# Patient Record
Sex: Female | Born: 2005 | Race: White | Hispanic: No | Marital: Single | Smoking: Never smoker
Health system: Southern US, Community
[De-identification: ages and names within clinical notes are randomized; demographics above are authoritative.]

## PROBLEM LIST (undated history)

## (undated) DIAGNOSIS — T7840XA Allergy, unspecified, initial encounter: Secondary | ICD-10-CM

## (undated) DIAGNOSIS — J45909 Unspecified asthma, uncomplicated: Secondary | ICD-10-CM

## (undated) HISTORY — DX: Allergy, unspecified, initial encounter: T78.40XA

## (undated) HISTORY — DX: Unspecified asthma, uncomplicated: J45.909

---
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
## 2023-02-05 DIAGNOSIS — Z20822 Contact with and (suspected) exposure to covid-19: Secondary | ICD-10-CM | POA: Diagnosis not present

## 2023-02-05 DIAGNOSIS — J40 Bronchitis, not specified as acute or chronic: Secondary | ICD-10-CM | POA: Diagnosis not present

## 2023-02-05 DIAGNOSIS — Z8709 Personal history of other diseases of the respiratory system: Secondary | ICD-10-CM | POA: Diagnosis not present

## 2023-02-05 DIAGNOSIS — R059 Cough, unspecified: Secondary | ICD-10-CM | POA: Diagnosis not present

## 2023-02-05 DIAGNOSIS — R062 Wheezing: Secondary | ICD-10-CM | POA: Diagnosis not present

## 2023-06-21 ENCOUNTER — Ambulatory Visit (INDEPENDENT_AMBULATORY_CARE_PROVIDER_SITE_OTHER): Payer: Medicaid Other | Admitting: Family Medicine

## 2023-06-21 ENCOUNTER — Encounter: Payer: Self-pay | Admitting: Family Medicine

## 2023-06-21 VITALS — BP 101/68 | HR 97 | Temp 98.4°F | Ht 62.0 in | Wt 92.4 lb

## 2023-06-21 DIAGNOSIS — F339 Major depressive disorder, recurrent, unspecified: Secondary | ICD-10-CM

## 2023-06-21 DIAGNOSIS — K5909 Other constipation: Secondary | ICD-10-CM | POA: Diagnosis not present

## 2023-06-21 DIAGNOSIS — K529 Noninfective gastroenteritis and colitis, unspecified: Secondary | ICD-10-CM

## 2023-06-21 DIAGNOSIS — Z23 Encounter for immunization: Secondary | ICD-10-CM

## 2023-06-21 DIAGNOSIS — F419 Anxiety disorder, unspecified: Secondary | ICD-10-CM

## 2023-06-21 DIAGNOSIS — R1084 Generalized abdominal pain: Secondary | ICD-10-CM | POA: Diagnosis not present

## 2023-06-21 MED ORDER — OMEPRAZOLE 20 MG PO CPDR: 20.0000 mg | DELAYED_RELEASE_CAPSULE | Freq: Every day | ORAL | 0 refills | Status: DC

## 2023-06-21 MED ORDER — BUPROPION HCL ER (XL) 150 MG PO TB24: 150.0000 mg | ORAL_TABLET | Freq: Every day | ORAL | 2 refills | Status: DC

## 2023-06-21 NOTE — Progress Notes (Signed)
Established Patient Office Visit  Subjective   Patient ID: Sheri Dickson, female    DOB: 17-Jun-2006  Age: 17 y.o. MRN: [id number]  Chief Complaint  Patient presents with   Abdominal Pain   Depression    Abdominal Pain This is a chronic problem. The current episode started more than 1 year ago. The onset quality is gradual. The problem occurs intermittently (lasts for a few weeks weeks at a time). Duration: hours to days. The pain is located in the RUQ and periumbilical region. Pain severity now: moderate to severe. The quality of the pain is described as sharp. The pain radiates to the periumbilical region. Associated symptoms include anorexia (fluctuates), belching, constipation, diarrhea (loose), flatus, nausea, a sore throat and vomiting. Pertinent negatives include no dysuria, frequency, hematochezia or hematuria. (Water brash) Relieved by: unsure. Past treatments include nothing. (PMH includes: celic panel)   Has hx of anxiety and depression. She was previously on Wellbutrin with good control and would like to restart on this. She did not have side effects when on Wellbutrin.      06/21/2023    4:06 PM 12/01/2021    3:14 PM 08/20/2021    8:34 AM  Depression screen PHQ 2/9  Decreased Interest 1 2 3   Down, Depressed, Hopeless 0 3 0  PHQ - 2 Score 1 5 3   Altered sleeping 1 2 3   Tired, decreased energy 2 2 3   Change in appetite 0 2 3  Feeling bad or failure about yourself  0 1 0  Trouble concentrating 3 0 1  Moving slowly or fidgety/restless 1 0 0  Suicidal thoughts 0 0 0  PHQ-9 Score 8 12 13   Difficult doing work/chores Somewhat difficult Somewhat difficult Very difficult      06/21/2023    4:05 PM 12/01/2021    3:16 PM 08/19/2021    4:36 PM 03/28/2021    1:49 PM  GAD 7 : Generalized Anxiety Score  Nervous, Anxious, on Edge 1 2 1 1   Control/stop worrying 1 2 0 1  Worry too much - different things 1 2 0 0  Trouble relaxing 1 2 2 3   Restless 2 2 1  0  Easily annoyed or  irritable 1 2 1 3   Afraid - awful might happen 1 2 2 1   Total GAD 7 Score 8 14 7 9   Anxiety Difficulty Somewhat difficult Somewhat difficult Somewhat difficult Very difficult       Review of Systems  HENT:  Positive for sore throat.   Gastrointestinal:  Positive for abdominal pain, anorexia (fluctuates), constipation, diarrhea (loose), flatus, nausea and vomiting. Negative for hematochezia.  Genitourinary:  Negative for dysuria, frequency and hematuria.      Objective:     BP 101/68   Pulse 97   Temp 98.4 F (36.9 C) (Temporal)   Ht 5\' 2"  (1.575 m)   Wt 92 lb 6 oz (41.9 kg)   SpO2 96%   BMI 16.90 kg/m    Physical Exam Vitals and nursing note reviewed.  Constitutional:      General: She is not in acute distress.    Appearance: She is not ill-appearing, toxic-appearing or diaphoretic.  HENT:     Head: Normocephalic and atraumatic.     Mouth/Throat:     Mouth: Mucous membranes are moist.     Pharynx: Oropharynx is clear.  Eyes:     General: No scleral icterus.    Extraocular Movements: Extraocular movements intact.     Pupils: Pupils  are equal, round, and reactive to light.  Cardiovascular:     Rate and Rhythm: Normal rate and regular rhythm.     Heart sounds: Normal heart sounds. No murmur heard. Pulmonary:     Effort: Pulmonary effort is normal. No respiratory distress.     Breath sounds: Normal breath sounds. No wheezing.  Abdominal:     General: Abdomen is flat. Bowel sounds are normal. There is no distension. There are no signs of injury.     Palpations: Abdomen is soft. There is no shifting dullness or mass.     Tenderness: There is generalized abdominal tenderness. There is no guarding or rebound. Negative signs include Murphy's sign and McBurney's sign.  Skin:    General: Skin is warm and dry.     Coloration: Skin is not jaundiced.  Neurological:     General: No focal deficit present.     Mental Status: She is alert and oriented to person, place, and  time.  Psychiatric:        Mood and Affect: Mood normal.      No results found for any visits on 06/21/23.    The ASCVD Risk score (Arnett DK, et al., 2019) failed to calculate for the following reasons:   The 2019 ASCVD risk score is only valid for ages 32 to 75    Assessment & Plan:   Olayinka was seen today for abdominal pain and depression.  Diagnoses and all orders for this visit:  Generalized abdominal pain Labs pending as below. Has had negative Celiac panel. Some of her symptoms are consistent with GERD, so will start a PPI for this. Referral to GI placed for further evaluation and treatment as she also reports chronic constipation and diarrhea.  -     Ambulatory referral to Gastroenterology -     CMP14+EGFR -     CBC with Differential/Platelet -     omeprazole (PRILOSEC) 20 MG capsule; Take 1 capsule (20 mg total) by mouth daily.  Chronic diarrhea -     Ambulatory referral to Gastroenterology  Chronic constipation -     Ambulatory referral to Gastroenterology  Anxiety Depression, recurrent (HCC) Not well controlled. Denies SI. Restart Wellbutrin.  -     buPROPion (WELLBUTRIN XL) 150 MG 24 hr tablet; Take 1 tablet (150 mg total) by mouth daily.  Encounter for immunization -     Flu vaccine trivalent PF, 6mos and older(Flulaval,Afluria,Fluarix,Fluzone)   Return in about 6 weeks (around 08/02/2023) for medication follow up.   The patient indicates understanding of these issues and agrees with the plan.  Gabriel Earing, FNP

## 2023-06-22 LAB — CBC WITH DIFFERENTIAL/PLATELET
Basophils Absolute: 0 10*3/uL (ref 0.0–0.2)
Basos: 1 %
EOS (ABSOLUTE): 0.4 10*3/uL (ref 0.0–0.4)
Eos: 5 %
Hematocrit: 43.9 % (ref 34.0–46.6)
Hemoglobin: 14.4 g/dL (ref 11.1–15.9)
Immature Grans (Abs): 0 10*3/uL (ref 0.0–0.1)
Immature Granulocytes: 0 %
Lymphocytes Absolute: 1.9 10*3/uL (ref 0.7–3.1)
Lymphs: 24 %
MCH: 29.7 pg (ref 26.6–33.0)
MCHC: 32.8 g/dL (ref 31.5–35.7)
MCV: 91 fL (ref 79–97)
Monocytes Absolute: 0.4 10*3/uL (ref 0.1–0.9)
Monocytes: 5 %
Neutrophils Absolute: 5.1 10*3/uL (ref 1.4–7.0)
Neutrophils: 65 %
Platelets: 280 10*3/uL (ref 150–450)
RBC: 4.85 x10E6/uL (ref 3.77–5.28)
RDW: 12.4 % (ref 11.7–15.4)
WBC: 8 10*3/uL (ref 3.4–10.8)

## 2023-06-22 LAB — CMP14+EGFR
ALT: 8 [IU]/L (ref 0–32)
AST: 17 [IU]/L (ref 0–40)
Albumin: 5 g/dL (ref 4.0–5.0)
Alkaline Phosphatase: 55 [IU]/L (ref 42–106)
BUN/Creatinine Ratio: 18 (ref 9–23)
BUN: 10 mg/dL (ref 6–20)
Bilirubin Total: 0.5 mg/dL (ref 0.0–1.2)
CO2: 21 mmol/L (ref 20–29)
Calcium: 10.5 mg/dL — ABNORMAL HIGH (ref 8.7–10.2)
Chloride: 103 mmol/L (ref 96–106)
Creatinine, Ser: 0.55 mg/dL — ABNORMAL LOW (ref 0.57–1.00)
Globulin, Total: 2.3 g/dL (ref 1.5–4.5)
Glucose: 77 mg/dL (ref 70–99)
Potassium: 3.9 mmol/L (ref 3.5–5.2)
Sodium: 141 mmol/L (ref 134–144)
Total Protein: 7.3 g/dL (ref 6.0–8.5)
eGFR: 135 mL/min/{1.73_m2} (ref >59)

## 2023-06-23 ENCOUNTER — Other Ambulatory Visit: Payer: Self-pay | Admitting: Family Medicine

## 2023-06-24 ENCOUNTER — Other Ambulatory Visit: Payer: Self-pay | Admitting: *Deleted

## 2023-06-28 ENCOUNTER — Other Ambulatory Visit: Payer: Medicaid Other

## 2023-06-28 NOTE — Progress Notes (Unsigned)
GI Office Note    Referring Provider: Gabriel Earing, FNP Primary Care Physician:  Gabriel Earing, FNP  Primary Gastroenterologist: Gerrit Friends.Rourk, MD  Chief Complaint   No chief complaint on file.  History of Present Illness   Sheri Dickson is a 17 y.o. female presenting today at the request of Gabriel Earing, FNP for abdominal pain/IBS with constipation and diarrhea.   Seen by PCP 06/21/2023.  Patient reported abdominal pain is chronic for her and started more than medial prior.  Onset has been gradual and occurs intermittently.  May last for few weeks at a time and can last hours to days.  Usually pain in the right upper quadrant and periumbilical region.  Pain described as sharp.  Symptoms associated include intermittent anorexia, belching, constipation, diarrhea, flatulence, nausea, vomiting.  She also noted a history of anxiety and depression and previously on Wellbutrin with good results, requested to restart.  She was started on omeprazole 20 mg once daily.  Advised to check CBC and CMP and referred to GI.  Notes a negative celiac panel in the past.      Latest Ref Rng & Units 06/21/2023    4:29 PM 05/24/2020   10:29 PM 04/18/2018    3:16 PM  CBC  WBC 3.4 - 10.8 x10E3/uL 8.0  6.2  5.4   Hemoglobin 11.1 - 15.9 g/dL 08.6  57.8  46.9   Hematocrit 34.0 - 46.6 % 43.9  37.9  40.8   Platelets 150 - 450 x10E3/uL 280  283  281       Latest Ref Rng & Units 06/21/2023    4:29 PM 05/24/2020   10:29 PM 04/18/2018    3:16 PM  CMP  Glucose 70 - 99 mg/dL 77  92  90   BUN 6 - 20 mg/dL 10  10  12    Creatinine 0.57 - 1.00 mg/dL 6.29  5.28  4.13   Sodium 134 - 144 mmol/L 141  138  140   Potassium 3.5 - 5.2 mmol/L 3.9  3.4  4.6   Chloride 96 - 106 mmol/L 103  107  103   CO2 20 - 29 mmol/L 21  23  19    Calcium 8.7 - 10.2 mg/dL 24.4  9.2  01.0   Total Protein 6.0 - 8.5 g/dL 7.3  7.2  7.1   Total Bilirubin 0.0 - 1.2 mg/dL 0.5  0.4  0.4   Alkaline Phos 42 - 106 IU/L 55  55  86    AST 0 - 40 IU/L 17  17  14    ALT 0 - 32 IU/L 8  12  9       Today:    Wt Readings from Last 3 Encounters:  06/21/23 92 lb 6 oz (41.9 kg) (<1%, Z= -2.56)*  12/01/21 (!) 90 lb (40.8 kg) (<1%, Z= -2.70)*  08/19/21 (!) 87 lb 12.8 oz (39.8 kg) (<1%, Z= -2.93)*   * Growth percentiles are based on CDC (Girls, 2-20 Years) data.    Current Outpatient Medications  Medication Sig Dispense Refill   albuterol (VENTOLIN HFA) 108 (90 Base) MCG/ACT inhaler INHALE 2 PUFFS EVERY 4 HOURS AS NEEDED FOR WHEEZING OR SHORTNESS OF BREATH (Patient not taking: Reported on 06/21/2023) 8.5 g 2   buPROPion (WELLBUTRIN XL) 150 MG 24 hr tablet Take 1 tablet (150 mg total) by mouth daily. 30 tablet 2   cetirizine (ZYRTEC) 10 MG tablet Take 1 tablet (10 mg total) by mouth daily.  90 tablet 4   omeprazole (PRILOSEC) 20 MG capsule Take 1 capsule (20 mg total) by mouth daily. 90 capsule 0   No current facility-administered medications for this visit.    Past Medical History:  Diagnosis Date   Allergy    Asthma     No past surgical history on file.  No family history on file.  Allergies as of 06/29/2023   (No Known Allergies)    Social History   Socioeconomic History   Marital status: Single    Spouse name: Not on file   Number of children: Not on file   Years of education: Not on file   Highest education level: Not on file  Occupational History   Not on file  Tobacco Use   Smoking status: Never    Passive exposure: Yes   Smokeless tobacco: Never  Vaping Use   Vaping status: Never Used  Substance and Sexual Activity   Alcohol use: No   Drug use: No   Sexual activity: Never  Other Topics Concern   Not on file  Social History Narrative   Not on file   Social Determinants of Health   Financial Resource Strain: Not on file  Food Insecurity: Not on file  Transportation Needs: Not on file  Physical Activity: Not on file  Stress: Not on file  Social Connections: Not on file  Intimate  Partner Violence: Not At Risk (05/26/2022)   Received from [facility], [facility]   Humiliation, Afraid, Rape, and Kick questionnaire    Fear of Current or Ex-Partner: No    Emotionally Abused: No    Physically Abused: No    Sexually Abused: No   Review of Systems   Gen: Denies any fever, chills, fatigue, weight loss, lack of appetite.  CV: Denies chest pain, heart palpitations, peripheral edema, syncope.  Resp: Denies shortness of breath at rest or with exertion. Denies wheezing or cough.  GI: see HPI GU : Denies urinary burning, urinary frequency, urinary hesitancy MS: Denies joint pain, muscle weakness, cramps, or limitation of movement.  Derm: Denies rash, itching, dry skin Psych: Denies depression, anxiety, memory loss, and confusion Heme: Denies bruising, bleeding, and enlarged lymph nodes.  Physical Exam   There were no vitals taken for this visit.  General:   Alert and oriented. Pleasant and cooperative. Well-nourished and well-developed.  Head:  Normocephalic and atraumatic. Eyes:  Without icterus, sclera clear and conjunctiva pink.  Ears:  Normal auditory acuity. Mouth:  No deformity or lesions, oral mucosa pink.  Lungs:  Clear to auscultation bilaterally. No wheezes, rales, or rhonchi. No distress.  Heart:  S1, S2 present without murmurs appreciated.  Abdomen:  +BS, soft, non-tender and non-distended. No HSM noted. No guarding or rebound. No masses appreciated.  Rectal:  Deferred  Msk:  Symmetrical without gross deformities. Normal posture. Extremities:  Without edema. Neurologic:  Alert and  oriented x4;  grossly normal neurologically. Skin:  Intact without significant lesions or rashes. Psych:  Alert and cooperative. Normal mood and affect.  Assessment   Sheri Dickson is a 17 y.o. female with a history of *** presenting today with   Abdominal pain:  Constipation:   GERD?:   PLAN   ***    Brooke Bonito, MSN, FNP-BC, AGACNP-BC [facility]

## 2023-06-29 ENCOUNTER — Ambulatory Visit: Payer: Medicaid Other | Admitting: Gastroenterology

## 2023-06-29 LAB — PTH, INTACT AND CALCIUM
Calcium: 9.6 mg/dL (ref 8.7–10.2)
PTH: 49 pg/mL (ref 15–65)

## 2023-06-29 NOTE — Progress Notes (Signed)
GI Office Note    Referring Provider: Gabriel Earing, FNP Primary Care Physician:  Gabriel Earing, FNP  Primary Gastroenterologist: Gerrit Friends.Rourk, MD  Chief Complaint   Chief Complaint  Patient presents with   Follow-up    Follow up. Still having severe abdominal pain. Stomach burns and cramps.    History of Present Illness   Sheri Dickson is a 17 y.o. female presenting today at the request of Gabriel Earing, FNP for abdominal pain/IBS with constipation and diarrhea.   Seen by PCP 06/21/2023.  Patient reported abdominal pain is chronic for her.  Onset has been gradual and occurs intermittently.  May last for few weeks at a time and can last hours to days.  Usually pain in the right upper quadrant and periumbilical region.  Pain described as sharp.  Symptoms associated include intermittent anorexia, belching, constipation, diarrhea, flatulence, nausea, vomiting.  She also noted a history of anxiety and depression and previously on Wellbutrin with good results, requested to restart.  She was started on omeprazole 20 mg once daily.  Advised to check CBC and CMP and referred to GI.  Notes a negative celiac panel in the past.      Latest Ref Rng & Units 06/21/2023    4:29 PM 05/24/2020   10:29 PM 04/18/2018    3:16 PM  CBC  WBC 3.4 - 10.8 x10E3/uL 8.0  6.2  5.4   Hemoglobin 11.1 - 15.9 g/dL 06.2  37.6  28.3   Hematocrit 34.0 - 46.6 % 43.9  37.9  40.8   Platelets 150 - 450 x10E3/uL 280  283  281       Latest Ref Rng & Units 06/21/2023    4:29 PM 05/24/2020   10:29 PM 04/18/2018    3:16 PM  CMP  Glucose 70 - 99 mg/dL 77  92  90   BUN 6 - 20 mg/dL 10  10  12    Creatinine 0.57 - 1.00 mg/dL 1.51  7.61  6.07   Sodium 134 - 144 mmol/L 141  138  140   Potassium 3.5 - 5.2 mmol/L 3.9  3.4  4.6   Chloride 96 - 106 mmol/L 103  107  103   CO2 20 - 29 mmol/L 21  23  19    Calcium 8.7 - 10.2 mg/dL 37.1  9.2  06.2   Total Protein 6.0 - 8.5 g/dL 7.3  7.2  7.1   Total Bilirubin 0.0 -  1.2 mg/dL 0.5  0.4  0.4   Alkaline Phos 42 - 106 IU/L 55  55  86   AST 0 - 40 IU/L 17  17  14    ALT 0 - 32 IU/L 8  12  9       Today: Has been having abdominal pain in her lower abdomen, mostly located in the LLQ but has been LUQ and RLQ at times. Has been severe enough to go to the ED but she usually just lays down to help with pain. Sometimes with movement she feels like something is ripping with moving certain ways with standing or stretching. Has on average once daily BM. Does have to strain. Stools usually bristol 1-3. Sometimes bristol 4. Pain does not really change even with bowel movements.  Has never had a CT scan in the past. Does not fele bloated. Does not have an appetite most of the times - hard to eat but then may not have an appetite other times. Has random food  sensitivities. School lunches used to really bother her. Anything she would eat from [employer] she would have significant urgency. Has constant nausea feeling as well. Even without catering she was still having issues with her meals at school. Graduated from school last year. Energy level fluctuates - sometimes doesn't feel like she can get out of bed. Is restless at times. Has pinched nerves as well and has pain in her back. Does feel like she has some symptoms after eating red meat at times. Has burning of her throat with anything carbonated and energy drinks. Sometimes weill drink coffee tea and juice but mostly water has about 8 bottles water daily. Does not drink milk and does not like the taste. Does eat a lot of fried and fatty foods. Mostly eats chicken.  Has had COVID - had a change in taste buds intermittently.   Tried omeprazole and felt more sick after taking it and felt it was bitter. Has a phobia of making medication at times. Has trouble gaining weight and has issues with cold/heat tolerance.   She reports being tested for celiac in the past and was negative. She dry swallows her medications. She can't take  medications with liquids or she will regurgitate. Sometimes has throat clearing and can get pain in her chest and has random throat discomfort mid upper chest/low neck area.   Sometimes has emesis and sometime just regurgitation of foamy spit and has the nausea like she may vomit. Sometimes occurs after eating, not pinpoint food but can occur at anytime.   Had some blood in her stool a few months ago. Had red in the stool itself. Was not super dark but not melena. Was a one time occurrence.   She eats [place] food and feels like that goes through her. No prior surgeries.   She does not think anyone in her family has gallbladder issues or pancreatic issues.  Paternal grandfather has colon cancer - passed this year (60-70s).Had it when he was younger then came back. Father has not had a colonoscopy.    Wt Readings from Last 3 Encounters:  07/06/23 89 lb (40.4 kg) (<1%, Z= -2.95)*  06/21/23 92 lb 6 oz (41.9 kg) (<1%, Z= -2.56)*  12/01/21 (!) 90 lb (40.8 kg) (<1%, Z= -2.70)*   * Growth percentiles are based on CDC (Girls, 2-20 Years) data.    Current Outpatient Medications  Medication Sig Dispense Refill   albuterol (VENTOLIN HFA) 108 (90 Base) MCG/ACT inhaler INHALE 2 PUFFS EVERY 4 HOURS AS NEEDED FOR WHEEZING OR SHORTNESS OF BREATH 8.5 g 2   buPROPion (WELLBUTRIN XL) 150 MG 24 hr tablet Take 1 tablet (150 mg total) by mouth daily. 30 tablet 2   cetirizine (ZYRTEC) 10 MG tablet Take 1 tablet (10 mg total) by mouth daily. 90 tablet 4   omeprazole (PRILOSEC) 20 MG capsule Take 1 capsule (20 mg total) by mouth daily. (Patient not taking: Reported on 07/06/2023) 90 capsule 0   No current facility-administered medications for this visit.    Past Medical History:  Diagnosis Date   Allergy    Asthma     No past surgical history on file.  No family history on file.  Allergies as of 07/06/2023   (No Known Allergies)    Social History   Socioeconomic History   Marital status: Single     Spouse name: Not on file   Number of children: Not on file   Years of education: Not on file   Highest education  level: Not on file  Occupational History   Not on file  Tobacco Use   Smoking status: Never    Passive exposure: Yes   Smokeless tobacco: Never  Vaping Use   Vaping status: Never Used  Substance and Sexual Activity   Alcohol use: No   Drug use: No   Sexual activity: Never  Other Topics Concern   Not on file  Social History Narrative   Not on file   Social Drivers of Health   Financial Resource Strain: Not on file  Food Insecurity: Not on file  Transportation Needs: Not on file  Physical Activity: Not on file  Stress: Not on file  Social Connections: Not on file  Intimate Partner Violence: Not At Risk (05/26/2022)   [facility]   Humiliation, Afraid, Rape, and Kick questionnaire    Fear of Current or Ex-Partner: No    Emotionally Abused: No    Physically Abused: No    Sexually Abused: No   Review of Systems   Gen: Denies any fever, chills, fatigue, weight loss, lack of appetite.  CV: Denies chest pain, heart palpitations, peripheral edema, syncope.  Resp: Denies shortness of breath at rest or with exertion. Denies wheezing or cough.  GI: see HPI GU : Denies urinary burning, urinary frequency, urinary hesitancy MS: Denies joint pain, muscle weakness, cramps, or limitation of movement.  Derm: Denies rash, itching, dry skin Psych: Denies depression, anxiety, memory loss, and confusion Heme: Denies bruising, bleeding, and enlarged lymph nodes.  Physical Exam   BP 116/76 (BP Location: Right Arm, Patient Position: Sitting, Cuff Size: Normal)   Pulse 86   Temp 98 F (36.7 C) (Temporal)   Ht 5\' 2"  (1.575 m)   Wt 89 lb (40.4 kg)   LMP 06/25/2023 (Approximate)   BMI 16.28 kg/m   General:   Alert and oriented. Pleasant and cooperative. Thin appearing.  Head:  Normocephalic and atraumatic. Eyes:  Without icterus,  sclera clear and conjunctiva pink.  Ears:  Normal auditory acuity. Mouth:  No deformity or lesions, oral mucosa pink.  Abdomen:  +BS, soft, non-distended. Ttp throughout abdomen. No HSM noted. No guarding or rebound. No masses appreciated.  Rectal:  Deferred  Msk:  Symmetrical without gross deformities. Normal posture. Extremities:  Without edema. Neurologic:  Alert and  oriented x4;  grossly normal neurologically. Skin:  Intact without significant lesions or rashes. Psych:  Alert and cooperative. Normal mood and affect.  Assessment   ALLA BRANDENBERGER is a 17 y.o. female with a history of anxiety/depression, asthma, and intermittent constipation/diarrhea presenting today for evaluation of RUQ and periumbilical abdominal pain and fluctuating bowel habits.   Abdominal pain: Generalized abdominal pain on exam.  Abdominal pain could be multifactorial in the setting of uncontrolled acid reflux as well as her bowel habits.  Some of her pain could be biliary colic given she has sometimes worsening symptoms after meals and does admit to eating a high-fat diet.  Sometimes has worsening symptoms with red meats.  Will assess her abdominal pain with a CT of the abdomen pelvis given she has never had any imaging.  Will also check for alpha gal given nausea and pain after eating red meats.  Could consider procedures with EGD and colonoscopy in the future if all other testing unrevealing and no improvement with symptom management.  Constipation: Could be having some intermittent urgency and overflow loose stools in the setting of constipation.  Generally has a bowel  movement daily but with some straining and [place] 1-3 stools.  Also has associated abdominal pain, pain does not always improve after bowel movements.  Thyroid function normal in 2019.  Has not been reassessed recently therefore we will recheck today.  Recommended high-fiber diet along with MiraLAX daily.  May need to consider colonoscopy.  Low BMI,  fatigue: Patient states she has had chronic low weight for as long as she can remember.  Has been as high as 90 pounds or little above.  BMI less than 16.5.  Does report sometimes having a lack of appetite but sometimes overeats as well.  Does report anxiety but no current signs/symptoms of eating disorder.  Given her significant fatigue at times with and/or without eating we will assess her thyroid function as well as her vitamin B12 and vitamin D.  GERD, nausea: Unable to tolerate omeprazole as she states this made her abdominal pain worse.  Does have some occasional burning symptoms as well as well as nausea and no significant vomiting.  No reports of dysphagia but does have some intermittent chest discomfort.  Will treat with famotidine 20 mg once daily for now and follow-up abdominal CT.  May need to consider upper endoscopy if no improvement and imaging negative.  PLAN   Famotidine 20 mg once daily. TSH, T4,  B12, Vitamin D, Alpha gal High protein, low fat diet CT A/P  Miralax daily and high fiber diet.  Potential for EGD and colonoscopy if no improvement and other testing negative.  Follow up 2 months.    Sheri Bonito, MSN, FNP-BC, AGACNP-BC [facility]

## 2023-07-06 ENCOUNTER — Encounter: Payer: Self-pay | Admitting: *Deleted

## 2023-07-06 ENCOUNTER — Encounter: Payer: Self-pay | Admitting: Gastroenterology

## 2023-07-06 ENCOUNTER — Ambulatory Visit (INDEPENDENT_AMBULATORY_CARE_PROVIDER_SITE_OTHER): Payer: Medicaid Other | Admitting: Gastroenterology

## 2023-07-06 ENCOUNTER — Telehealth: Payer: Self-pay | Admitting: *Deleted

## 2023-07-06 VITALS — BP 116/76 | HR 86 | Temp 98.0°F | Ht 62.0 in | Wt 89.0 lb

## 2023-07-06 DIAGNOSIS — K219 Gastro-esophageal reflux disease without esophagitis: Secondary | ICD-10-CM | POA: Diagnosis not present

## 2023-07-06 DIAGNOSIS — R1031 Right lower quadrant pain: Secondary | ICD-10-CM

## 2023-07-06 DIAGNOSIS — Z681 Body mass index (BMI) 19 or less, adult: Secondary | ICD-10-CM | POA: Diagnosis not present

## 2023-07-06 DIAGNOSIS — R1032 Left lower quadrant pain: Secondary | ICD-10-CM

## 2023-07-06 DIAGNOSIS — R110 Nausea: Secondary | ICD-10-CM

## 2023-07-06 DIAGNOSIS — K5900 Constipation, unspecified: Secondary | ICD-10-CM

## 2023-07-06 DIAGNOSIS — R1084 Generalized abdominal pain: Secondary | ICD-10-CM | POA: Diagnosis not present

## 2023-07-06 DIAGNOSIS — R5383 Other fatigue: Secondary | ICD-10-CM

## 2023-07-06 NOTE — Telephone Encounter (Signed)
Carelon PA: Order ID: [id number]       Authorized  Approval Valid Through: 07/06/2023 - 09/03/2023

## 2023-07-06 NOTE — Patient Instructions (Addendum)
Abdominal pain: Checking labs Will schedule CT of your abdomen Follow a low fat/gallbladder diet   Upper throat burning/reflux: Take famotidine 20 mg once daily, 30 minutes prior to breakfast. Follow a GERD diet:  Avoid fried, fatty, greasy, spicy, citrus foods. Avoid caffeine and carbonated beverages. Avoid chocolate. Try eating 4-6 small meals a day rather than 3 large meals. Do not eat within 3 hours of laying down. Prop head of bed up on wood or bricks to create a 6 inch incline.  Constipation: High fiber diet Miralax 17g once daily in 8 oz fluid of your choice (water, juice, coffee, tea).   Please have blood work completed at [facility].  We will call you with results once they have been received. Please allow 3-5 business days for review. 2 locations for [facility] in [place]:              1. [address], [place]              2. [address] [place], [place]   Follow up in 2 months.   It was a pleasure to see you today. I want to create trusting relationships with patients. If you receive a survey regarding your visit,  I greatly appreciate you taking time to fill this out on paper or through your MyChart. I value your feedback.  Brooke Bonito, MSN, FNP-BC, AGACNP-BC [facility]

## 2023-07-07 NOTE — Telephone Encounter (Signed)
Informed pt that CT is scheduled for Wednesday 08/11/23, arrive at 2:45 pm to check in at [facility].

## 2023-07-27 ENCOUNTER — Other Ambulatory Visit: Payer: Self-pay | Admitting: Family Medicine

## 2023-07-27 DIAGNOSIS — F419 Anxiety disorder, unspecified: Secondary | ICD-10-CM

## 2023-07-27 DIAGNOSIS — F339 Major depressive disorder, recurrent, unspecified: Secondary | ICD-10-CM

## 2023-08-02 ENCOUNTER — Ambulatory Visit (INDEPENDENT_AMBULATORY_CARE_PROVIDER_SITE_OTHER): Payer: Medicaid Other

## 2023-08-02 ENCOUNTER — Ambulatory Visit (INDEPENDENT_AMBULATORY_CARE_PROVIDER_SITE_OTHER): Payer: Medicaid Other | Admitting: Family Medicine

## 2023-08-02 ENCOUNTER — Encounter: Payer: Self-pay | Admitting: Family Medicine

## 2023-08-02 VITALS — BP 99/59 | HR 97 | Temp 98.0°F | Ht 62.0 in | Wt 89.8 lb

## 2023-08-02 DIAGNOSIS — M546 Pain in thoracic spine: Secondary | ICD-10-CM

## 2023-08-02 DIAGNOSIS — R1084 Generalized abdominal pain: Secondary | ICD-10-CM

## 2023-08-02 DIAGNOSIS — M5442 Lumbago with sciatica, left side: Secondary | ICD-10-CM

## 2023-08-02 DIAGNOSIS — M542 Cervicalgia: Secondary | ICD-10-CM

## 2023-08-02 DIAGNOSIS — K5900 Constipation, unspecified: Secondary | ICD-10-CM | POA: Diagnosis not present

## 2023-08-02 DIAGNOSIS — R110 Nausea: Secondary | ICD-10-CM | POA: Diagnosis not present

## 2023-08-02 DIAGNOSIS — R5383 Other fatigue: Secondary | ICD-10-CM | POA: Diagnosis not present

## 2023-08-02 DIAGNOSIS — R1031 Right lower quadrant pain: Secondary | ICD-10-CM | POA: Diagnosis not present

## 2023-08-02 DIAGNOSIS — G8929 Other chronic pain: Secondary | ICD-10-CM | POA: Diagnosis not present

## 2023-08-02 DIAGNOSIS — F419 Anxiety disorder, unspecified: Secondary | ICD-10-CM

## 2023-08-02 DIAGNOSIS — F339 Major depressive disorder, recurrent, unspecified: Secondary | ICD-10-CM | POA: Diagnosis not present

## 2023-08-02 DIAGNOSIS — Z681 Body mass index (BMI) 19 or less, adult: Secondary | ICD-10-CM | POA: Diagnosis not present

## 2023-08-02 DIAGNOSIS — R1032 Left lower quadrant pain: Secondary | ICD-10-CM | POA: Diagnosis not present

## 2023-08-02 DIAGNOSIS — M5432 Sciatica, left side: Secondary | ICD-10-CM | POA: Diagnosis not present

## 2023-08-02 MED ORDER — BUPROPION HCL ER (XL) 150 MG PO TB24: 150.0000 mg | ORAL_TABLET | Freq: Every day | ORAL | 3 refills | Status: DC

## 2023-08-02 NOTE — Progress Notes (Signed)
 Established Patient Office Visit  Subjective   Patient ID: Sheri Dickson, female    DOB: Jul 04, 2006  Age: 18 y.o. MRN: [id number]  Chief Complaint  Patient presents with   Depression    HPI Detrice is here for follow up of anxiety and depression. She restarted on Wellbutrin  6 weeks ago. She reports that this was a big improvement. Unfortunately she lost the bottle of medication 2 weeks ago, so she has not taken this in the last 2 weeks.   She has had an appt with GI for chronic abdominal pain. She has ordered for labs today and has a CT scheduled on 08/10/22.  She also reports chronic neck pain on left side for years. Pain radiates down left shoulder and left arm at times. Intermittent numbness and tinging in left arm. Sometimes arm feels week. Symptoms wax and wane. Reports she had seen chiropractor years ago and was told she had 2 pinched nerves. No recent imaging.    Also has hx of chronic left sided sciatica. Reports intermittent pain down left leg with intermittent numbness/tingling in left leg. Also reports intermittent weakness in left leg. Symptoms wax and wane. Uses lidocaine patches with improvement. Denies urinary symptoms, saddle anesthesia.   Also has chronic thoracic back pain without radiation. Prior rib xray showed minimal midthoracic spine dextroscoliosis      08/02/2023    3:29 PM 06/21/2023    4:06 PM 12/01/2021    3:14 PM  Depression screen PHQ 2/9  Decreased Interest 1 1 2   Down, Depressed, Hopeless 1 0 3  PHQ - 2 Score 2 1 5   Altered sleeping 0 1 2  Tired, decreased energy 2 2 2   Change in appetite 1 0 2  Feeling bad or failure about yourself  0 0 1  Trouble concentrating 0 3 0  Moving slowly or fidgety/restless 1 1 0  Suicidal thoughts 0 0 0  PHQ-9 Score 6 8 12   Difficult doing work/chores Somewhat difficult Somewhat difficult Somewhat difficult      08/02/2023    3:29 PM 06/21/2023    4:05 PM 12/01/2021    3:16 PM 08/19/2021    4:36 PM  GAD 7 : Generalized  Anxiety Score  Nervous, Anxious, on Edge 1 1 2 1   Control/stop worrying 1 1 2  0  Worry too much - different things 1 1 2  0  Trouble relaxing 2 1 2 2   Restless 2 2 2 1   Easily annoyed or irritable 1 1 2 1   Afraid - awful might happen 1 1 2 2   Total GAD 7 Score 9 8 14 7   Anxiety Difficulty Somewhat difficult Somewhat difficult Somewhat difficult Somewhat difficult       ROS As per HPI.    Objective:     BP (!) 99/59   Pulse 97   Temp 98 F (36.7 C) (Temporal)   Ht 5' 2 (1.575 m)   Wt 89 lb 12.8 oz (40.7 kg)   LMP 06/25/2023 (Approximate)   SpO2 98%   BMI 16.42 kg/m    Physical Exam Vitals and nursing note reviewed.  Constitutional:      General: She is not in acute distress.    Appearance: Normal appearance. She is not ill-appearing, toxic-appearing or diaphoretic.  Cardiovascular:     Rate and Rhythm: Normal rate and regular rhythm.     Heart sounds: Normal heart sounds. No murmur heard. Pulmonary:     Effort: Pulmonary effort is normal. No respiratory distress.  Breath sounds: Normal breath sounds. No wheezing, rhonchi or rales.  Musculoskeletal:     Cervical back: No edema or erythema. Muscular tenderness (left sided) present. No spinous process tenderness. Normal range of motion.     Thoracic back: Tenderness (paraspinal) present. No swelling, edema, deformity, signs of trauma, lacerations, spasms or bony tenderness. Normal range of motion. No scoliosis.     Lumbar back: Tenderness and bony tenderness present. No swelling, edema, deformity, signs of trauma or lacerations. Normal range of motion. Positive left straight leg raise test.     Right lower leg: No edema.     Left lower leg: No edema.  Skin:    General: Skin is warm and dry.  Neurological:     General: No focal deficit present.     Mental Status: She is alert and oriented to person, place, and time.  Psychiatric:        Mood and Affect: Mood normal.        Behavior: Behavior normal.      No  results found for any visits on 08/02/23.    The ASCVD Risk score (Arnett DK, et al., 2019) failed to calculate for the following reasons:   The 2019 ASCVD risk score is only valid for ages 6 to 78    Assessment & Plan:   Depression, recurrent (HCC) Anxiety Has been out of medication for 2 weeks, but reports had improved when taking consistently. Restart wellbutrin .  -     buPROPion  HCl ER (XL); Take 1 tablet (150 mg total) by mouth daily.  Dispense: 90 tablet; Refill: 3  Chronic bilateral low back pain with left-sided sciatica Xray today in office. Radiology reports pending. Referral to PT discussed and placed. Will notify patient of report when available.  -     DG Lumbar Spine 2-3 Views; Future -     Ambulatory referral to Physical Therapy  Chronic midline thoracic back pain Discussed prior xray with midthoracic scoliosis. Referral to PT.  -     Ambulatory referral to Physical Therapy  Chronic neck pain Xray today in office. Radiology reports pending. Referral to PT discussed and placed. Will notify patient of report when available.  -     DG Cervical Spine 2 or 3 views; Future -     Ambulatory referral to Physical Therapy  Generalized abdominal pain Continue follow up with GI. Has labs and CT ordered by GI.  Return in about 4 weeks (around 08/30/2023) for chronic follow up.   The patient indicates understanding of these issues and agrees with the plan.  Annabella CHRISTELLA Search, FNP

## 2023-08-04 ENCOUNTER — Other Ambulatory Visit: Payer: Self-pay | Admitting: Family Medicine

## 2023-08-04 LAB — ALPHA-GAL PANEL
Allergen Lamb IgE: 1.45 kU/L — AB
Beef IgE: 1.18 kU/L — AB
IgE (Immunoglobulin E), Serum: 322 [IU]/mL (ref 6–495)
Pork IgE: 3.93 kU/L — AB

## 2023-08-04 LAB — TSH+FREE T4
Free T4: 1.3 ng/dL (ref 0.93–1.60)
TSH: 0.671 u[IU]/mL (ref 0.450–4.500)

## 2023-08-04 LAB — VITAMIN B12: Vitamin B-12: 567 pg/mL (ref 232–1245)

## 2023-08-04 LAB — C-REACTIVE PROTEIN: CRP: <1 mg/L (ref 0–10)

## 2023-08-04 LAB — VITAMIN D 25 HYDROXY (VIT D DEFICIENCY, FRACTURES): Vit D, 25-Hydroxy: 12 ng/mL — ABNORMAL LOW (ref 30.0–100.0)

## 2023-08-09 ENCOUNTER — Encounter: Payer: Self-pay | Admitting: Gastroenterology

## 2023-08-09 ENCOUNTER — Encounter: Payer: Self-pay | Admitting: *Deleted

## 2023-08-10 ENCOUNTER — Other Ambulatory Visit: Payer: Self-pay

## 2023-08-10 ENCOUNTER — Ambulatory Visit: Payer: Medicaid Other | Attending: Family Medicine

## 2023-08-10 DIAGNOSIS — M542 Cervicalgia: Secondary | ICD-10-CM | POA: Insufficient documentation

## 2023-08-10 DIAGNOSIS — M6281 Muscle weakness (generalized): Secondary | ICD-10-CM | POA: Diagnosis not present

## 2023-08-10 DIAGNOSIS — M546 Pain in thoracic spine: Secondary | ICD-10-CM | POA: Diagnosis not present

## 2023-08-10 DIAGNOSIS — M5416 Radiculopathy, lumbar region: Secondary | ICD-10-CM | POA: Diagnosis not present

## 2023-08-10 DIAGNOSIS — M5412 Radiculopathy, cervical region: Secondary | ICD-10-CM | POA: Diagnosis not present

## 2023-08-10 DIAGNOSIS — M5442 Lumbago with sciatica, left side: Secondary | ICD-10-CM | POA: Diagnosis not present

## 2023-08-10 DIAGNOSIS — G8929 Other chronic pain: Secondary | ICD-10-CM | POA: Diagnosis not present

## 2023-08-10 NOTE — Therapy (Signed)
OUTPATIENT PHYSICAL THERAPY THORACOLUMBAR EVALUATION   Patient Name: Sheri Dickson MRN: [id number] DOB:2005-09-29, 18 y.o., female Today's Date: 08/10/2023  END OF SESSION:  PT End of Session - 08/10/23 1407     Visit Number 1    Number of Visits 12    Date for PT Re-Evaluation 10/15/23    PT Start Time 1408    PT Stop Time 1455    PT Time Calculation (min) 47 min    Activity Tolerance Patient limited by pain    Behavior During Therapy Flat affect             Past Medical History:  Diagnosis Date   Allergy    Asthma    History reviewed. No pertinent surgical history. Patient Active Problem List   Diagnosis Date Noted   Dysmenorrhea 08/19/2021   Multiple atypical skin moles 03/07/2021   Acute pain of left shoulder 09/04/2020   Depression, major, single episode, mild (HCC) 09/06/2018   Family history of bipolar disorder 05/25/2018   Family history of borderline personality disorder 05/25/2018   Anxiety 05/19/2018   Insomnia 03/19/2015   ADHD (attention deficit hyperactivity disorder) 02/13/2014   Asthma 03/08/2013   Allergic rhinitis 03/08/2013   REFERRING PROVIDER: Gabriel Earing, FNP   REFERRING DIAG: Chronic bilateral low back pain with left-sided sciatica, Chronic midline thoracic back pain, Chronic neck pain   Rationale for Evaluation and Treatment: Rehabilitation  THERAPY DIAG:  Radiculopathy, cervical region  Radiculopathy, lumbar region  Muscle weakness (generalized)  ONSET DATE: 7+ years ago  SUBJECTIVE:                                                                                                                                                                                           SUBJECTIVE STATEMENT: Patient reports that she has always had back pain, but she has begun to bothering her left leg more. Her pain starts in her hip and then radiates down her left leg to her knee. She tried to going to a chiropractor which helped some, but she  stopped going. She felt that it would help for a few hours, but it would not last. She is right hand dominant.   PERTINENT HISTORY:  Asthma, ADHD, anxiety, and depression  NECK PAIN:  Are you having pain? Yes: NPRS scale: Current: 3-4/10 Worst: 10/10 Best: 2-3/10 Pain location: neck and left shoulder  Pain description: varies depending on the day Aggravating factors: prolonged driving (2 hours), movement, reaching overhead, heat, stress Relieving factors: cold, laying down, biofreeze  BACK PAIN:  Are you having pain? Yes: NPRS scale: Current: 3-4/10 Worst: 8-9/10 Best: 2-3/10  Pain location: low back radiating to her left knee Pain description: patient is unsure how to describe her symptosm Aggravating factors: prolonged driving (2 hours), heat   Relieving factors: cold, biofreeze  PRECAUTIONS: None  RED FLAGS: None   WEIGHT BEARING RESTRICTIONS: No  FALLS:  Has patient fallen in last 6 months?  No, but she feels like her leg may give out on her.   LIVING ENVIRONMENT: Lives with: lives with their family Lives in: House/apartment Stairs: No Has following equipment at home: None  PLOF: Independent  PATIENT GOALS: reduced pain, and be able to sit longer (varies up to 3 hours)   NEXT MD VISIT: 08/30/23  OBJECTIVE:  Note: Objective measures were completed at Evaluation unless otherwise noted.  DIAGNOSTIC FINDINGS: 08/02/23 cervical and lumbar x-rays IMPRESSION: Negative cervical spine radiographs.  IMPRESSION: Negative.  PATIENT SURVEYS:  Modified Oswestry 28% disability  NDI 42% disability  COGNITION: Overall cognitive status: Within functional limits for tasks assessed     SENSATION: Patient reports frequent tingling in her left shoulder.   POSTURE: rounded shoulders, forward head, and decreased lumbar lordosis  PALPATION: TTP: bilateral upper trapezius, levator scapulae, infraspinatus, deltoids, latissimus dorsi, scapular stabilizers, cervical paraspinals,  suboccipitals, thoracic paraspinals, lumbar paraspinals, and QL  JOINT MOBILITY:  Unable to assess cervical, thoracic, or lumbar joint mobility due to increased pain with palpation to spinous processes   CERVICAL ROM:   Active ROM A/PROM (deg) eval  Flexion 48  Extension 36  Right lateral flexion 34  Left lateral flexion 36  Right rotation 55  Left rotation 51   (Blank rows = not tested)   LUMBAR ROM:   AROM eval  Flexion 50  Extension 24  Right lateral flexion 16  Left lateral flexion 14  Right rotation No limitation  Left rotation No limitation   (Blank rows = not tested)  UPPER EXTREMITY ROM:  Active ROM Right eval Left eval  Shoulder flexion 130 99; limited by pain   Shoulder extension    Shoulder abduction 87 70; limited by pain   Shoulder adduction    Shoulder extension    Shoulder internal rotation To T8 To T10; uncomfortable  Shoulder external rotation To T5 To T2; uncomfortable  Elbow flexion    Elbow extension    Wrist flexion    Wrist extension    Wrist ulnar deviation    Wrist radial deviation    Wrist pronation    Wrist supination     (Blank rows = not tested)   LOWER EXTREMITY ROM: WFL for activities assessed  UPPER EXTREMITY MMT:  MMT Right eval Left eval  Shoulder flexion 3+/5 3+/5  Shoulder extension    Shoulder abduction 3+/5; right shoulder pain 3+/5  Shoulder adduction    Shoulder extension    Shoulder internal rotation    Shoulder external rotation    Middle trapezius    Lower trapezius    Elbow flexion 3+/5 3+/5  Elbow extension 3/5 3/5  Wrist flexion    Wrist extension    Wrist ulnar deviation    Wrist radial deviation    Wrist pronation    Wrist supination    Grip strength 25; painful  15   (Blank rows = not tested)   LOWER EXTREMITY MMT:    MMT Right eval Left eval  Hip flexion 4/5 3/5  Hip extension    Hip abduction    Hip adduction    Hip internal rotation    Hip external rotation  Knee flexion 4-/5  3/5  Knee extension 4-/5 3/5  Ankle dorsiflexion 3+/5 3/5  Ankle plantarflexion    Ankle inversion    Ankle eversion     (Blank rows = not tested)  CERVICAL SPECIAL TESTS:  Spurling's test: Positive, Distraction test: Negative, and Sharp pursor's test: Negative  GAIT: Assistive device utilized: None Level of assistance: Complete Independence  TREATMENT DATE:                                                                                                                                  PATIENT EDUCATION:  Education details: plan of care, prognosis, healing, and goals for physical therapy Person educated: Patient Education method: Explanation Education comprehension: verbalized understanding  HOME EXERCISE PROGRAM:   ASSESSMENT:  CLINICAL IMPRESSION: Patient is a 18 y.o. female who was seen today for physical therapy evaluation and treatment for chronic cervical and lumbar pain with radiating symptoms into her left upper and lower extremities, respectively. She presented with moderate to high pain severity and irritability with palpation to her cervical, thoracic, and lumbar musculature being the most aggravating to her familiar symptoms. She may benefit from physical therapy to address her impairments to maximize her functional mobility.    OBJECTIVE IMPAIRMENTS: decreased activity tolerance, decreased knowledge of condition, decreased mobility, difficulty walking, decreased ROM, decreased strength, hypomobility, impaired tone, impaired UE functional use, postural dysfunction, and pain.   ACTIVITY LIMITATIONS: carrying, lifting, standing, sleeping, stairs, and locomotion level  PARTICIPATION LIMITATIONS: cleaning, driving, shopping, and community activity  PERSONAL FACTORS: Past/current experiences, Time since onset of injury/illness/exacerbation, and 3+ comorbidities: Asthma, ADHD, anxiety, and depression  are also affecting patient's functional outcome.   REHAB POTENTIAL:  Fair    CLINICAL DECISION MAKING: Unstable/unpredictable  EVALUATION COMPLEXITY: High   GOALS: Goals reviewed with patient? Yes  SHORT TERM GOALS: Target date: 08/31/23  Patient will be independent with her initial HEP.  Baseline: Goal status: INITIAL  2.  Patient will be able to complete her daily activities without her familiar symptoms exceeding 8/10. Baseline:  Goal status: INITIAL  3.  Patient will be able to demonstrate at least 60 degrees of cervical rotation bilaterally for improved awareness or her surroundings.   Baseline:  Goal status: INITIAL  4.  Patient will improve her NDI score to 32% disability for improved perceived function with her daily activities.  Baseline:  Goal status: INITIAL  LONG TERM GOALS: Target date: 09/21/23  Patient will be independent with her advanced HEP.  Baseline:  Goal status: INITIAL  2.  Patient will be able to complete her daily activities without her familiar symptoms exceeding 6/10. Baseline:  Goal status: INITIAL  3.  Patient will improve her Modified Oswestry score to 18% or less for improved perceived function with her daily activities.  Baseline:  Goal status: INITIAL  4.  Patient will improve her NDI score to 22% disability for improved perceived function with her daily  activities.  Baseline:  Goal status: INITIAL  5.  Patient will improve her left lower extremity strength to at least 4-/5 for improved functional mobility.  Baseline:  Goal status: INITIAL  PLAN:  PT FREQUENCY: 2x/week  PT DURATION: 6 weeks  PLANNED INTERVENTIONS: 97164- PT Re-evaluation, 97110-Therapeutic exercises, 97530- Therapeutic activity, 97112- Neuromuscular re-education, 97535- Self Care, 46962- Manual therapy, Patient/Family education, Balance training, Dry Needling, Joint mobilization, Spinal mobilization, Cryotherapy, and Moist heat.  PLAN FOR NEXT SESSION: Nustep, postural reeducation, isometrics, and manual therapy   Granville Lewis, PT 08/10/2023, 4:45 PM

## 2023-08-11 ENCOUNTER — Ambulatory Visit (HOSPITAL_COMMUNITY): Payer: Medicaid Other

## 2023-08-12 ENCOUNTER — Other Ambulatory Visit: Payer: Self-pay | Admitting: Gastroenterology

## 2023-08-12 DIAGNOSIS — E559 Vitamin D deficiency, unspecified: Secondary | ICD-10-CM

## 2023-08-12 MED ORDER — VITAMIN D (ERGOCALCIFEROL) 1.25 MG (50000 UNIT) PO CAPS: 50000.0000 [IU] | ORAL_CAPSULE | ORAL | 0 refills | Status: DC

## 2023-08-30 ENCOUNTER — Ambulatory Visit (INDEPENDENT_AMBULATORY_CARE_PROVIDER_SITE_OTHER): Payer: Medicaid Other | Admitting: Family Medicine

## 2023-08-30 ENCOUNTER — Encounter: Payer: Self-pay | Admitting: Family Medicine

## 2023-08-30 VITALS — BP 93/63 | HR 71 | Temp 98.1°F | Ht 62.0 in | Wt 88.4 lb

## 2023-08-30 DIAGNOSIS — F339 Major depressive disorder, recurrent, unspecified: Secondary | ICD-10-CM | POA: Diagnosis not present

## 2023-08-30 DIAGNOSIS — E559 Vitamin D deficiency, unspecified: Secondary | ICD-10-CM | POA: Diagnosis not present

## 2023-08-30 DIAGNOSIS — M5442 Lumbago with sciatica, left side: Secondary | ICD-10-CM | POA: Diagnosis not present

## 2023-08-30 DIAGNOSIS — Z91018 Allergy to other foods: Secondary | ICD-10-CM | POA: Diagnosis not present

## 2023-08-30 DIAGNOSIS — M542 Cervicalgia: Secondary | ICD-10-CM

## 2023-08-30 DIAGNOSIS — G8929 Other chronic pain: Secondary | ICD-10-CM | POA: Insufficient documentation

## 2023-08-30 DIAGNOSIS — M546 Pain in thoracic spine: Secondary | ICD-10-CM | POA: Diagnosis not present

## 2023-08-30 DIAGNOSIS — R1084 Generalized abdominal pain: Secondary | ICD-10-CM | POA: Insufficient documentation

## 2023-08-30 DIAGNOSIS — F419 Anxiety disorder, unspecified: Secondary | ICD-10-CM

## 2023-08-30 NOTE — Patient Instructions (Signed)
 Call [phone] to schedule PT appointments.

## 2023-08-30 NOTE — Progress Notes (Signed)
 Acute Office Visit  Subjective:     Patient ID: Sheri Dickson, female    DOB: 03-20-2006, 18 y.o.   MRN: [id number]  Chief Complaint  Patient presents with   Medical Management of Chronic Issues    HPI Patient is in today for follow up of anxiety and depression. She has restarted on Wellbutrin  and reports feeling good with current regimen.   She did have her PT evaluation for chronic back and neck pain. She has not had a follow up appt yet however.   She has seen GI for abdominal pain. She has an upcoming CT. She was diagnosed with alpha gal. She has been avoiding beef, lamb, and pork. This has helped a lot with her symptoms. She also was prescribed a supplement for low vitamin D .       08/30/2023    1:05 PM 08/30/2023   12:59 PM 08/02/2023    3:29 PM  Depression screen PHQ 2/9  Decreased Interest 1 0 1  Down, Depressed, Hopeless 0 0 1  PHQ - 2 Score 1 0 2  Altered sleeping 0  0  Tired, decreased energy 1  2  Change in appetite 1  1  Feeling bad or failure about yourself  0  0  Trouble concentrating 2  0  Moving slowly or fidgety/restless 3  1  Suicidal thoughts 0  0  PHQ-9 Score 8  6  Difficult doing work/chores Somewhat difficult  Somewhat difficult      08/30/2023    1:07 PM 08/02/2023    3:29 PM 06/21/2023    4:05 PM 12/01/2021    3:16 PM  GAD 7 : Generalized Anxiety Score  Nervous, Anxious, on Edge 1 1 1 2   Control/stop worrying 1 1 1 2   Worry too much - different things 0 1 1 2   Trouble relaxing 2 2 1 2   Restless 2 2 2 2   Easily annoyed or irritable 1 1 1 2   Afraid - awful might happen 1 1 1 2   Total GAD 7 Score 8 9 8 14   Anxiety Difficulty Somewhat difficult Somewhat difficult Somewhat difficult Somewhat difficult     ROS As per HPI.      Objective:    Ht 5\' 2"  (1.575 m)   LMP 07/22/2023 (Approximate)   BMI 16.42 kg/m    Physical Exam Vitals and nursing note reviewed.  Constitutional:      General: She is not in acute distress.     Appearance: Normal appearance. She is not ill-appearing.  Cardiovascular:     Rate and Rhythm: Normal rate and regular rhythm.     Pulses: Normal pulses.     Heart sounds: Normal heart sounds. No murmur heard. Pulmonary:     Effort: Pulmonary effort is normal. No respiratory distress.     Breath sounds: Normal breath sounds.  Musculoskeletal:     Right lower leg: No edema.     Left lower leg: No edema.  Skin:    General: Skin is warm and dry.  Neurological:     General: No focal deficit present.     Mental Status: She is alert and oriented to person, place, and time.  Psychiatric:        Mood and Affect: Mood normal.        Behavior: Behavior normal.        Thought Content: Thought content normal.        Judgment: Judgment normal.     No results  found for any visits on 08/30/23.      Assessment & Plan:   [name] was seen today for medical management of chronic issues.  Diagnoses and all orders for this visit:  Anxiety Depression, recurrent (HCC) Well controlled on current regimen.  Continue wellbutrin .   Chronic midline thoracic back pain Chronic neck pain Chronic bilateral low back pain with left-sided sciatica Contact info provided to schedule PT appts.   Generalized abdominal pain Has upcoming CT with GI.   Allergy to alpha-gal Avoid beef, lamb, pork.   Vitamin D  deficiency Start supplement as prescribed.   Return in about 3 months (around 11/27/2023) for chronic follow up.  The patient indicates understanding of these issues and agrees with the plan.  Sheri Huger, FNP

## 2023-09-02 ENCOUNTER — Ambulatory Visit (HOSPITAL_COMMUNITY): Admission: RE | Admit: 2023-09-02 | Payer: Medicaid Other | Source: Ambulatory Visit

## 2023-09-07 ENCOUNTER — Encounter: Payer: Medicaid Other | Admitting: Physical Therapy

## 2023-09-13 ENCOUNTER — Ambulatory Visit: Payer: Medicaid Other

## 2023-09-20 ENCOUNTER — Encounter: Payer: Self-pay | Admitting: *Deleted

## 2023-09-20 ENCOUNTER — Ambulatory Visit: Payer: Medicaid Other | Attending: Family Medicine | Admitting: *Deleted

## 2023-09-20 DIAGNOSIS — M5412 Radiculopathy, cervical region: Secondary | ICD-10-CM | POA: Insufficient documentation

## 2023-09-20 DIAGNOSIS — M6281 Muscle weakness (generalized): Secondary | ICD-10-CM | POA: Diagnosis not present

## 2023-09-20 DIAGNOSIS — M5416 Radiculopathy, lumbar region: Secondary | ICD-10-CM | POA: Diagnosis not present

## 2023-09-20 NOTE — Therapy (Signed)
 OUTPATIENT PHYSICAL THERAPY THORACOLUMBAR TREATMENT   Patient Name: Sheri Dickson MRN: [id number] DOB:[birth year], 18 y.o., female Today's Date: 09/20/2023  END OF SESSION:  PT End of Session - 09/20/23 1438     Visit Number 2    Number of Visits 12    Date for PT Re-Evaluation 10/15/23    PT Start Time 1430    PT Stop Time 1516    PT Time Calculation (min) 46 min             Past Medical History:  Diagnosis Date   Allergy    Asthma    History reviewed. No pertinent surgical history. 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   Acute pain of left shoulder 09/04/2020   Depression, major, single episode, mild (HCC) 09/06/2018   Family history of bipolar disorder 05/25/2018   Family history of borderline personality disorder 05/25/2018   Anxiety 05/19/2018   Insomnia 03/19/2015   ADHD (attention deficit hyperactivity disorder) 02/13/2014   Asthma 03/08/2013   Allergic rhinitis 03/08/2013   REFERRING PROVIDER: Gabriel Earing, FNP   REFERRING DIAG: Chronic bilateral low back pain with left-sided sciatica, Chronic midline thoracic back pain, Chronic neck pain   Rationale for Evaluation and Treatment: Rehabilitation  THERAPY DIAG:  No diagnosis found.  ONSET DATE: 7+ years ago  SUBJECTIVE:                                                                                                                                                                                           SUBJECTIVE STATEMENT: Patient reports LT shldr/ neck pain.    PERTINENT HISTORY:  Asthma, ADHD, anxiety, and depression  NECK PAIN:  Are you having pain? Yes: NPRS  scale: Current: 3-4/10 Worst: 10/10 Best: 2-3/10 Pain location: neck and left shoulder  Pain description: varies depending on the day Aggravating factors: prolonged driving (2 hours), movement, reaching overhead, heat, stress Relieving factors: cold, laying down, biofreeze  BACK PAIN:  Are you having pain? Yes: NPRS scale: Current: 3-4/10 Worst: 8-9/10 Best: 2-3/10  Pain location: low back radiating to her left knee Pain description: patient is unsure how to describe her symptosm Aggravating factors: prolonged driving (2 hours), heat   Relieving factors: cold, biofreeze  PRECAUTIONS: None  RED FLAGS: None   WEIGHT BEARING RESTRICTIONS: No  FALLS:  Has patient fallen in last 6 months?  No, but she feels like her leg may give out on her.   LIVING ENVIRONMENT: Lives with: lives with their family Lives in: House/apartment Stairs: No Has following equipment at home: None  PLOF: Independent  PATIENT GOALS: reduced pain, and be able to sit longer (varies up to 3 hours)   NEXT MD VISIT: 08/30/23  OBJECTIVE:  Note: Objective measures were completed at Evaluation unless otherwise noted.  DIAGNOSTIC FINDINGS: 08/02/23 cervical and lumbar x-rays IMPRESSION: Negative cervical spine radiographs.  IMPRESSION: Negative.  PATIENT SURVEYS:  Modified Oswestry 28% disability  NDI 42% disability  COGNITION: Overall cognitive status: Within functional limits for tasks assessed     SENSATION: Patient reports frequent tingling in her left shoulder.   POSTURE: rounded shoulders, forward head, and decreased lumbar lordosis  PALPATION: TTP: bilateral upper trapezius, levator scapulae, infraspinatus, deltoids, latissimus dorsi, scapular stabilizers, cervical paraspinals, suboccipitals, thoracic paraspinals, lumbar paraspinals, and QL  JOINT MOBILITY:  Unable to assess cervical, thoracic, or lumbar joint mobility due to increased pain with palpation to spinous processes   CERVICAL  ROM:   Active ROM A/PROM (deg) eval  Flexion 48  Extension 36  Right lateral flexion 34  Left lateral flexion 36  Right rotation 55  Left rotation 51   (Blank rows = not tested)   LUMBAR ROM:   AROM eval  Flexion 50  Extension 24  Right lateral flexion 16  Left lateral flexion 14  Right rotation No limitation  Left rotation No limitation   (Blank rows = not tested)  UPPER EXTREMITY ROM:  Active ROM Right eval Left eval  Shoulder flexion 130 99; limited by pain   Shoulder extension    Shoulder abduction 87 70; limited by pain   Shoulder adduction    Shoulder extension    Shoulder internal rotation To T8 To T10; uncomfortable  Shoulder external rotation To T5 To T2; uncomfortable  Elbow flexion    Elbow extension    Wrist flexion    Wrist extension    Wrist ulnar deviation    Wrist radial deviation    Wrist pronation    Wrist supination     (Blank rows = not tested)   LOWER EXTREMITY ROM: WFL for activities assessed  UPPER EXTREMITY MMT:  MMT Right eval Left eval  Shoulder flexion 3+/5 3+/5  Shoulder extension    Shoulder abduction 3+/5; right shoulder pain 3+/5  Shoulder adduction    Shoulder extension    Shoulder internal rotation    Shoulder external rotation    Middle trapezius    Lower trapezius    Elbow flexion 3+/5 3+/5  Elbow extension 3/5 3/5  Wrist flexion    Wrist extension    Wrist ulnar deviation    Wrist radial deviation    Wrist pronation    Wrist supination    Grip strength 25; painful  15   (Blank rows = not tested)   LOWER EXTREMITY MMT:    MMT Right eval Left eval  Hip flexion 4/5 3/5  Hip extension    Hip abduction    Hip adduction    Hip internal rotation    Hip external rotation    Knee flexion 4-/5 3/5  Knee extension 4-/5 3/5  Ankle dorsiflexion 3+/5 3/5  Ankle plantarflexion    Ankle inversion    Ankle eversion     (Blank rows = not tested)  CERVICAL SPECIAL TESTS:  Spurling's test: Positive,  Distraction test: Negative, and Sharp pursor's test: Negative  GAIT: Assistive device utilized: None Level of assistance: Complete Independence  TREATMENT DATE:  09/20/23                                    EXERCISE LOG    Exercise Repetitions and Resistance Comments  Nustep L3 x 10 mins   Rows Yellow 3x10   Ext Yellow  3x10   H-abd Yellow x10   SOC pelvic tilts         Blank cell = exercise not performed today                                                                                                                              Handout and yellow tband given and reviewed for HEP Manual: STW and TPR to bil. Cerv. Paras as well as LT Utrap and levator in hook lying position  PATIENT EDUCATION:  Education details: plan of care, prognosis, healing, and goals for physical therapy Person educated: Patient Education method: Explanation Education comprehension: verbalized understanding  HOME EXERCISE PROGRAM:   ASSESSMENT:  CLINICAL IMPRESSION: Patient arrived today doing fair , but with neck pain and LT side Utrap/ shldr pain. Rx focused on postural exs f/b manual STW/ TPR to address taut mm/ Tps. Pt given yellow tband and handout for HEP. Good TPR and decreased pain end of session.   OBJECTIVE IMPAIRMENTS: decreased activity tolerance, decreased knowledge of condition, decreased mobility, difficulty walking, decreased ROM, decreased strength, hypomobility, impaired tone, impaired UE functional use, postural dysfunction, and pain.   ACTIVITY LIMITATIONS: carrying, lifting, standing, sleeping, stairs, and locomotion level  PARTICIPATION LIMITATIONS: cleaning, driving, shopping, and community activity  PERSONAL FACTORS: Past/current experiences, Time since onset of injury/illness/exacerbation, and 3+ comorbidities: Asthma, ADHD, anxiety, and depression  are also affecting patient's functional outcome.   REHAB POTENTIAL: Fair    CLINICAL DECISION MAKING:  Unstable/unpredictable  EVALUATION COMPLEXITY: High   GOALS: Goals reviewed with patient? Yes  SHORT TERM GOALS: Target date: 08/31/23  Patient will be independent with her initial HEP.  Baseline: Goal status: INITIAL  2.  Patient will be able to complete her daily activities without her familiar symptoms exceeding 8/10. Baseline:  Goal status: INITIAL  3.  Patient will be able to demonstrate at least 60 degrees of cervical rotation bilaterally for improved awareness or her surroundings.   Baseline:  Goal status: INITIAL  4.  Patient will improve her NDI score to 32% disability for improved perceived function with her daily activities.  Baseline:  Goal status: INITIAL  LONG TERM GOALS: Target date: 09/21/23  Patient will be independent with her advanced HEP.  Baseline:  Goal status: INITIAL  2.  Patient will be able to complete her daily activities without her familiar symptoms exceeding 6/10. Baseline:  Goal status: INITIAL  3.  Patient will improve her Modified  Oswestry score to 18% or less for improved perceived function with her daily activities.  Baseline:  Goal status: INITIAL  4.  Patient will improve her NDI score to 22% disability for improved perceived function with her daily activities.  Baseline:  Goal status: INITIAL  5.  Patient will improve her left lower extremity strength to at least 4-/5 for improved functional mobility.  Baseline:  Goal status: INITIAL  PLAN:  PT FREQUENCY: 2x/week  PT DURATION: 6 weeks  PLANNED INTERVENTIONS: 97164- PT Re-evaluation, 97110-Therapeutic exercises, 97530- Therapeutic activity, 97112- Neuromuscular re-education, 97535- Self Care, 16109- Manual therapy, Patient/Family education, Balance training, Dry Needling, Joint mobilization, Spinal mobilization, Cryotherapy, and Moist heat.  PLAN FOR NEXT SESSION: Nustep, postural reeducation, isometrics, and manual therapy   Deklynn Charlet,CHRIS, PTA 09/20/2023, 4:26 PM

## 2023-09-27 ENCOUNTER — Encounter

## 2023-09-30 ENCOUNTER — Encounter

## 2023-11-29 ENCOUNTER — Ambulatory Visit: Payer: Medicaid Other | Admitting: Family Medicine

## 2023-11-30 ENCOUNTER — Encounter: Payer: Self-pay | Admitting: Family Medicine

## 2023-12-14 ENCOUNTER — Emergency Department (HOSPITAL_COMMUNITY)

## 2023-12-14 ENCOUNTER — Ambulatory Visit: Payer: Self-pay

## 2023-12-14 ENCOUNTER — Encounter (HOSPITAL_COMMUNITY): Payer: Self-pay

## 2023-12-14 ENCOUNTER — Emergency Department (HOSPITAL_COMMUNITY)
Admission: EM | Admit: 2023-12-14 | Discharge: 2023-12-14 | Disposition: A | Discharge status: Home / Self Care | Attending: Emergency Medicine | Admitting: Emergency Medicine

## 2023-12-14 ENCOUNTER — Other Ambulatory Visit: Payer: Self-pay

## 2023-12-14 DIAGNOSIS — R1011 Right upper quadrant pain: Secondary | ICD-10-CM | POA: Diagnosis not present

## 2023-12-14 DIAGNOSIS — N9489 Other specified conditions associated with female genital organs and menstrual cycle: Secondary | ICD-10-CM | POA: Diagnosis not present

## 2023-12-14 DIAGNOSIS — R1012 Left upper quadrant pain: Secondary | ICD-10-CM | POA: Diagnosis not present

## 2023-12-14 DIAGNOSIS — R1030 Lower abdominal pain, unspecified: Secondary | ICD-10-CM | POA: Diagnosis present

## 2023-12-14 DIAGNOSIS — R1031 Right lower quadrant pain: Secondary | ICD-10-CM | POA: Insufficient documentation

## 2023-12-14 DIAGNOSIS — R102 Pelvic and perineal pain: Secondary | ICD-10-CM | POA: Diagnosis not present

## 2023-12-14 LAB — CBC WITH DIFFERENTIAL/PLATELET
Abs Immature Granulocytes: 0.01 10*3/uL (ref 0.00–0.07)
Basophils Absolute: 0 10*3/uL (ref 0.0–0.1)
Basophils Relative: 1 %
Eosinophils Absolute: 0.2 10*3/uL (ref 0.0–0.5)
Eosinophils Relative: 4 %
HCT: 39.3 % (ref 36.0–46.0)
Hemoglobin: 13.8 g/dL (ref 12.0–15.0)
Immature Granulocytes: 0 %
Lymphocytes Relative: 27 %
Lymphs Abs: 1.7 10*3/uL (ref 0.7–4.0)
MCH: 31.3 pg (ref 26.0–34.0)
MCHC: 35.1 g/dL (ref 30.0–36.0)
MCV: 89.1 fL (ref 80.0–100.0)
Monocytes Absolute: 0.4 10*3/uL (ref 0.1–1.0)
Monocytes Relative: 6 %
Neutro Abs: 4 10*3/uL (ref 1.7–7.7)
Neutrophils Relative %: 62 %
Platelets: 237 10*3/uL (ref 150–400)
RBC: 4.41 MIL/uL (ref 3.87–5.11)
RDW: 11.9 % (ref 11.5–15.5)
WBC: 6.2 10*3/uL (ref 4.0–10.5)
nRBC: 0 % (ref 0.0–0.2)

## 2023-12-14 LAB — URINALYSIS, W/ REFLEX TO CULTURE (INFECTION SUSPECTED)
Bacteria, UA: NONE SEEN
Bilirubin Urine: NEGATIVE
Glucose, UA: NEGATIVE mg/dL
Ketones, ur: 5 mg/dL — AB
Nitrite: NEGATIVE
Protein, ur: NEGATIVE mg/dL
Specific Gravity, Urine: 1.017 (ref 1.005–1.030)
pH: 5 (ref 5.0–8.0)

## 2023-12-14 LAB — COMPREHENSIVE METABOLIC PANEL WITH GFR
ALT: 13 U/L (ref 0–44)
AST: 17 U/L (ref 15–41)
Albumin: 4.4 g/dL (ref 3.5–5.0)
Alkaline Phosphatase: 41 U/L (ref 38–126)
Anion gap: 9 (ref 5–15)
BUN: 9 mg/dL (ref 6–20)
CO2: 22 mmol/L (ref 22–32)
Calcium: 9.3 mg/dL (ref 8.9–10.3)
Chloride: 107 mmol/L (ref 98–111)
Creatinine, Ser: 0.49 mg/dL (ref 0.44–1.00)
GFR, Estimated: >60 mL/min (ref >60)
Glucose, Bld: 83 mg/dL (ref 70–99)
Potassium: 3.7 mmol/L (ref 3.5–5.1)
Sodium: 138 mmol/L (ref 135–145)
Total Bilirubin: 0.8 mg/dL (ref 0.0–1.2)
Total Protein: 7.2 g/dL (ref 6.5–8.1)

## 2023-12-14 LAB — LIPASE, BLOOD: Lipase: 34 U/L (ref 11–51)

## 2023-12-14 LAB — HCG, SERUM, QUALITATIVE: Preg, Serum: NEGATIVE

## 2023-12-14 MED ORDER — IOHEXOL 300 MG/ML  SOLN
80.0000 mL | Freq: Once | INTRAMUSCULAR | Status: AC | PRN
  Administered 2023-12-14: 80 mL via INTRAVENOUS

## 2023-12-14 MED ORDER — SODIUM CHLORIDE 0.9 % IV BOLUS
500.0000 mL | Freq: Once | INTRAVENOUS | Status: AC
  Administered 2023-12-14: 500 mL via INTRAVENOUS

## 2023-12-14 MED ORDER — KETOROLAC TROMETHAMINE 15 MG/ML IJ SOLN
15.0000 mg | Freq: Once | INTRAMUSCULAR | Status: AC
  Administered 2023-12-14: 15 mg via INTRAVENOUS
  Filled 2023-12-14: qty 1

## 2023-12-14 MED ORDER — IOHEXOL 9 MG/ML PO SOLN
500.0000 mL | ORAL | Status: AC
  Administered 2023-12-14: 500 mL via ORAL

## 2023-12-14 NOTE — ED Provider Notes (Signed)
 [facility] EMERGENCY DEPARTMENT AT [facility] Provider Note   CSN: [id number] Arrival date & time: 12/14/23  1816     History  Chief Complaint  Patient presents with   Hip Pain   Back Pain    Sheri Dickson is a 18 y.o. female.  HPI 18 year old female presents with abdominal pain.  Symptoms originally started 3 days ago.  She had intense left-sided abdominal pain.  Pain has now moved and is more right lower.  Comes and goes and was particularly bad a couple days ago and then on and off including today.  She has had poor appetite though right now feels hungry.  She denies any vomiting but has had nausea and has been constipated.  No urinary or vaginal symptoms.  Reports she has never been sexually active.  She is not currently on her cycle.  Pain right now is about an 8 out of 10.  No fevers.  Her right low back also hurts.  Went to urgent care and was sent here for possible torsion versus appendicitis.  Home Medications Prior to Admission medications   Medication Sig Start Date End Date Taking? Authorizing Provider  albuterol  (VENTOLIN  HFA) 108 (90 Base) MCG/ACT inhaler INHALE 2 PUFFS EVERY 4 HOURS AS NEEDED FOR WHEEZING OR SHORTNESS OF BREATH 12/10/22   Albertha Huger, FNP  buPROPion  (WELLBUTRIN  XL) 150 MG 24 hr tablet Take 1 tablet (150 mg total) by mouth daily. 08/02/23   Albertha Huger, FNP  cetirizine  (ZYRTEC ) 10 MG tablet Take 1 tablet (10 mg total) by mouth daily. 12/10/22   Albertha Huger, FNP  omeprazole  (PRILOSEC) 20 MG capsule Take 1 capsule (20 mg total) by mouth daily. 06/21/23   Albertha Huger, FNP  Vitamin D , Ergocalciferol , (DRISDOL ) 1.25 MG (50000 UNIT) CAPS capsule Take 1 capsule (50,000 Units total) by mouth every 7 (seven) days. 08/12/23   Eustacio Highman, NP      Allergies    Alpha-gal    Review of Systems   Review of Systems  Constitutional:  Negative for fever.  Gastrointestinal:  Positive for abdominal pain, constipation and nausea.  Negative for vomiting.  Genitourinary:  Negative for dysuria, vaginal bleeding and vaginal discharge.  Musculoskeletal:  Positive for back pain.    Physical Exam Updated Vital Signs BP 105/73   Pulse 91   Temp 98.4 F (36.9 C) (Oral)   Resp 18   Ht 5\' 3"  (1.6 m)   Wt 39.9 kg   SpO2 99%   BMI 15.59 kg/m  Physical Exam Vitals and nursing note reviewed.  Constitutional:      General: She is not in acute distress.    Appearance: She is well-developed. She is not ill-appearing or diaphoretic.  HENT:     Head: Normocephalic and atraumatic.  Cardiovascular:     Rate and Rhythm: Normal rate and regular rhythm.     Heart sounds: Normal heart sounds.  Pulmonary:     Effort: Pulmonary effort is normal.  Abdominal:     Palpations: Abdomen is soft.     Tenderness: There is abdominal tenderness (worst in RLQ) in the right upper quadrant, right lower quadrant and left upper quadrant. There is no right CVA tenderness or left CVA tenderness.  Skin:    General: Skin is warm and dry.  Neurological:     Mental Status: She is alert.     ED Results / Procedures / Treatments   Labs (all labs ordered are listed,  but only abnormal results are displayed) Labs Reviewed  URINALYSIS, W/ REFLEX TO CULTURE (INFECTION SUSPECTED) - Abnormal; Notable for the following components:      Result Value   APPearance HAZY (*)    Hgb urine dipstick MODERATE (*)    Ketones, ur 5 (*)    Leukocytes,Ua MODERATE (*)    All other components within normal limits  CBC WITH DIFFERENTIAL/PLATELET  COMPREHENSIVE METABOLIC PANEL WITH GFR  LIPASE, BLOOD  HCG, SERUM, QUALITATIVE    EKG None  Radiology CT ABDOMEN PELVIS W CONTRAST Result Date: 12/14/2023 CLINICAL DATA:  Right lower quadrant pain. EXAM: CT ABDOMEN AND PELVIS WITH CONTRAST TECHNIQUE: Multidetector CT imaging of the abdomen and pelvis was performed using the standard protocol following bolus administration of intravenous contrast. RADIATION DOSE  REDUCTION: This exam was performed according to the departmental dose-optimization program which includes automated exposure control, adjustment of the mA and/or kV according to patient size and/or use of iterative reconstruction technique. CONTRAST:  80mL OMNIPAQUE IOHEXOL 300 MG/ML  SOLN COMPARISON:  Pelvic ultrasound earlier today FINDINGS: Lower chest: Clear lung bases. Hepatobiliary: Unremarkable appearance of the liver and gallbladder allowing for mild motion artifact. Pancreas: Unremarkable. No pancreatic ductal dilatation or surrounding inflammatory changes. Spleen: Normal in size without focal abnormality. Adrenals/Urinary Tract: Normal adrenal glands. No hydronephrosis or renal calculi. No renal inflammation. Unremarkable urinary bladder. Stomach/Bowel: Normal appendix, for example coronal series 4 images 54 through 58. Small amount of fluid within the terminal ileum but no wall thickening or inflammatory change. No bowel obstruction, administered enteric contrast reaches the distal small bowel. Small volume of formed stool in the colon. Vascular/Lymphatic: No acute vascular findings. Normal caliber abdominal aorta. The portal vein is patent. No abdominopelvic adenopathy. Reproductive: Uterus and bilateral adnexa are unremarkable. Other: Small amount of free fluid in the pelvis, likely physiologic in a patient of this age. No upper abdominal ascites. No free air. No abdominal wall or inguinal hernia. Musculoskeletal: There are no acute or suspicious osseous abnormalities. IMPRESSION: 1. Normal appendix. 2. Small amount of free fluid in the pelvis, likely physiologic in a patient of this age. Electronically Signed   By: Chadwick Colonel M.D.   On: 12/14/2023 22:37   US  PELVIC TRANSABD W/PELVIC DOPPLER Result Date: 12/14/2023 CLINICAL DATA:  Pelvic pain EXAM: TRANSABDOMINAL ULTRASOUND OF PELVIS DOPPLER ULTRASOUND OF OVARIES TECHNIQUE: Transabdominal ultrasound examination of the pelvis was performed  including evaluation of the uterus, ovaries, adnexal regions, and pelvic cul-de-sac. Color and duplex Doppler ultrasound was utilized to evaluate blood flow to the ovaries. COMPARISON:  None Available. FINDINGS: Uterus Measurements: 7.7 x 3.9 x 5 cm = volume: 79 mL. No fibroids or other mass visualized. Endometrium Thickness: 10 mm.  No focal abnormality visualized. Right ovary Measurements: 2.6 x 1.4 x 1.5 cm = volume: 2.9 mL. Normal appearance/no adnexal mass. Left ovary Measurements: 2.7 x 1.3 x 2.6 cm = volume: 4.8 mL. Normal appearance/no adnexal mass. Pulsed Doppler evaluation demonstrates normal low-resistance arterial and venous waveforms in both ovaries. Other: No free pelvic fluid. IMPRESSION: Nonenlarged ovaries. No sonographic findings to suggest ovarian torsion, at this time. Otherwise, normal pelvic ultrasound. Electronically Signed   By: Rance Burrows M.D.   On: 12/14/2023 21:08    Procedures Procedures    Medications Ordered in ED Medications  ketorolac (TORADOL) 15 MG/ML injection 15 mg (15 mg Intravenous Given 12/14/23 2003)  sodium chloride 0.9 % bolus 500 mL (0 mLs Intravenous Stopped 12/14/23 2240)  iohexol (OMNIPAQUE) 9 MG/ML oral solution  500 mL (500 mLs Oral Contrast Given 12/14/23 2223)  iohexol (OMNIPAQUE) 300 MG/ML solution 80 mL (80 mLs Intravenous Contrast Given 12/14/23 2223)    ED Course/ Medical Decision Making/ A&P                                 Medical Decision Making Amount and/or Complexity of Data Reviewed Labs: ordered.    Details: Normal WBC Radiology: ordered and independent interpretation performed.    Details: No ovarian torsion  Risk Prescription drug management.   She presents with lower abdominal pain.  She denies being sexually active or having STI symptoms.  Ultrasound is negative for ovarian torsion or significant cysts.  CT has no evidence of appendicitis.  She feels better with some Toradol.  She does not have any urinary symptoms and so  I think with some leukocytes but no bacteria this is unlikely to be UTI.  Will not give antibiotics.  At this point, unclear cause of her pain but I think she is stable for discharge home and can follow-up with PCP.  Will give return precautions.        Final Clinical Impression(s) / ED Diagnoses Final diagnoses:  Lower abdominal pain    Rx / DC Orders ED Discharge Orders     None         Jerilynn Montenegro, MD 12/14/23 2305

## 2023-12-14 NOTE — ED Triage Notes (Addendum)
 Pt from home complains of pelvic pain that radiates to lower back.  Pt endorses cramping, Nausea, chills, urinary frequency, and constipation, weakness and lightheadedness. Pt sent from UC for Ultrasound.

## 2023-12-14 NOTE — Telephone Encounter (Signed)
 Copied from CRM [id number]. Topic: Clinical - Red Word Triage >> Dec 14, 2023  3:45 PM Elle L wrote: Red Word that prompted transfer to Nurse Triage: The patient states she always has painful periods where she cannot get up, vomits, and screams or cries from the pain. However, since Sunday she is having random sharp pains in her stomach that shoot up and shoot down. The cramping has been the worst it has ever been. Now she has a pain in the lower part of her stomach on her left side that made it difficult for her to breathe and walk from the pain. She has been nauseas and feels a pulsating feeling in her uterus.    Chief Complaint: Abdominal cramping/Frequency with urination Symptoms: pain, back pain Frequency: x 2 days Pertinent Negatives: Patient denies fever, diarrhea,  Disposition: [] ED /[x] Urgent Care (no appt availability in office) / [] Appointment(In office/virtual)/ []  [place] [facility]/ [] Home Care/ [] Refused Recommended Disposition /[] [place] [facility]/ []  Follow-up with PCP Additional Notes: Patient states that she has been having this issue since she was possibly 18 years old. Patient states that if she stretches the wrong way or moves a certain way she feels pain in her lower abdomen. Patient states pain started on her lower left side of her abdomen. Patient states she drinks a lot of water. She also started having some pain on her right side. She also mentioned feeling like she could feel a throbbing in her uterus. Patient states that her periods are miserable. Patient states that she has asked for pap smears but she was advised that they didn't do them on people under 31 years old. Patient is advised that with her symptoms it is recommended that she is seen and evaluated within the next 4 hours. There is no availability for an appointment in the next four hours at the patient's PCP office so it is recommended that the patient goes to Urgent Care for further evaluation at  this time. Patient states that she considered going to the Emergency Room recently for this pain. Patient's LMP was May 2-5. She states she should have her next menstrual period somewhere around June 3rd. Pain also mentioned having urinary frequency. She denies any chance of pregnancy at this time. Patient states she will go to an Urgent Care and she is also advised that if anything worsens to go to an Emergency Room. Patient verbalized understanding. She is also advised that she can call us  back with any questions and this will be sent to her primary care provider as well. Patient verbalized understanding.                   Reason for Disposition  [1] MILD-MODERATE pain AND [2] constant AND [3] present > 2 hours  Answer Assessment - Initial Assessment Questions 1. LOCATION: "Where does it hurt?"      Lower left 2. RADIATION: "Does the pain shoot anywhere else?" (e.g., chest, back)     Left and right lower abdomen 3. ONSET: "When did the pain begin?" (e.g., minutes, hours or days ago)      2 days ago 4. SUDDEN: "Gradual or sudden onset?"     ----- 5. PATTERN "Does the pain come and go, or is it constant?"    - If it comes and goes: "How long does it last?" "Do you have pain now?"     (Note: Comes and goes means the pain is intermittent. It goes away completely between bouts.)    -  If constant: "Is it getting better, staying the same, or getting worse?"      (Note: Constant means the pain never goes away completely; most serious pain is constant and gets worse.)      Comes and goes 6. SEVERITY: "How bad is the pain?"  (e.g., Scale 1-10; mild, moderate, or severe)    - MILD (1-3): Doesn't interfere with normal activities, abdomen soft and not tender to touch.     - MODERATE (4-7): Interferes with normal activities or awakens from sleep, abdomen tender to touch.     - SEVERE (8-10): Excruciating pain, doubled over, unable to do any normal activities.       3 7. RECURRENT  SYMPTOM: "Have you ever had this type of stomach pain before?" If Yes, ask: "When was the last time?" and "What happened that time?"      ---- 8. CAUSE: "What do you think is causing the stomach pain?"     ----- 9. RELIEVING/AGGRAVATING FACTORS: "What makes it better or worse?" (e.g., antacids, bending or twisting motion, bowel movement)     Certain movements 10. OTHER SYMPTOMS: "Do you have any other symptoms?" (e.g., back pain, diarrhea, fever, urination pain, vomiting)       Frequency, back pain,  11. PREGNANCY: "Is there any chance you are pregnant?" "When was your last menstrual period?"       No--last menstrual period May 2nd-5th  Protocols used: Abdominal Pain - [facility]

## 2023-12-14 NOTE — Discharge Instructions (Addendum)
 If you develop worsening, continued, or recurrent abdominal pain, uncontrolled vomiting, fever, chest or back pain, or any other new/concerning symptoms then return to the ER for evaluation.

## 2023-12-15 ENCOUNTER — Ambulatory Visit: Payer: Self-pay

## 2023-12-15 NOTE — Telephone Encounter (Signed)
 Copied from CRM [id number]. Topic: Clinical - Red Word Triage >> Dec 15, 2023 12:38 PM Tiffany H wrote: Red Word that prompted transfer to Nurse Triage: Patient advised that she went to ED yesterday for abdominal pain. She had a vaginal ultrasound and has been bleeding ever since. Rates her pain between 8 and 10. Her pain is audible.  Chief Complaint: vaginal bleeding after ultrasound; states 8/10 Symptoms: pain, can't sleep Frequency: constant Pertinent Negatives: Patient denies fever, sob Disposition: [x] ED /[] Urgent Care (no appt availability in office) / [] Appointment(In office/virtual)/ []  [place] [facility]/ [] Home Care/ [] Refused Recommended Disposition /[] [place] [facility]/ []  Follow-up with PCP Additional Notes: instructed to go to the ER; Pcp office updated.   Reason for Disposition  MODERATE vaginal bleeding (e.g., soaking 1 pad or tampon per hour and present > 6 hours; 1 menstrual cup every 6 hours)  Answer Assessment - Initial Assessment Questions 1. AMOUNT: "Describe the bleeding that you are having."    - SPOTTING: spotting, or pinkish / brownish mucous discharge; does not fill panty liner or pad    - MILD:  less than 1 pad / hour; less than patient's usual menstrual bleeding   - MODERATE: 1-2 pads / hour; 1 menstrual cup every 6 hours; small-medium blood clots (e.g., pea, grape, small coin)   - SEVERE: soaking 2 or more pads/hour for 2 or more hours; 1 menstrual cup every 2 hours; bleeding not contained by pads or continuous red blood from vagina; large blood clots (e.g., golf ball, large coin)      Started after ultrasound; states could be her period, states a lot of blood but cannot tell RN if she has soaked through more than one pad in an hour. 2. ONSET: "When did the bleeding begin?" "Is it continuing now?"     yesterday 3. MENSTRUAL PERIOD: "When was the last normal menstrual period?" "How is this different than your period?"     Should start next week 4.  REGULARITY: "How regular are your periods?"     Every 28 days 5. ABDOMEN PAIN: "Do you have any pain?" "How bad is the pain?"  (e.g., Scale 1-10; mild, moderate, or severe)   - MILD (1-3): doesn't interfere with normal activities, abdomen soft and not tender to touch    - MODERATE (4-7): interferes with normal activities or awakens from sleep, abdomen tender to touch    - SEVERE (8-10): excruciating pain, doubled over, unable to do any normal activities      Yes, 8/10 6. PREGNANCY: "Is there any chance you are pregnant?" "When was your last menstrual period?"     na 7. BREASTFEEDING: "Are you breastfeeding?"     na 8. HORMONE MEDICINES: "Are you taking any hormone medicines, prescription or over-the-counter?" (e.g., birth control pills, estrogen)     To ER 9. BLOOD THINNER MEDICINES: "Do you take any blood thinners?" (e.g., Coumadin / warfarin, Pradaxa / dabigatran, aspirin)     To ER 10. CAUSE: "What do you think is causing the bleeding?" (e.g., recent gyn surgery, recent gyn procedure; known bleeding disorder, cervical cancer, polycystic ovarian disease, fibroids)         To ER 11. HEMODYNAMIC STATUS: "Are you weak or feeling lightheaded?" If Yes, ask: "Can you stand and walk normally?"        To ER 12. OTHER SYMPTOMS: "What other symptoms are you having with the bleeding?" (e.g., passed tissue, vaginal discharge, fever, menstrual-type cramps)       cramping  Protocols used: Vaginal Bleeding - Abnormal-A-AH

## 2024-01-14 ENCOUNTER — Ambulatory Visit (INDEPENDENT_AMBULATORY_CARE_PROVIDER_SITE_OTHER): Admitting: Family Medicine

## 2024-01-14 ENCOUNTER — Encounter: Payer: Self-pay | Admitting: Family Medicine

## 2024-01-14 VITALS — BP 96/66 | HR 83 | Temp 98.0°F | Ht 63.0 in | Wt 85.6 lb

## 2024-01-14 DIAGNOSIS — Z0000 Encounter for general adult medical examination without abnormal findings: Secondary | ICD-10-CM

## 2024-01-14 DIAGNOSIS — N920 Excessive and frequent menstruation with regular cycle: Secondary | ICD-10-CM

## 2024-01-14 DIAGNOSIS — Z681 Body mass index (BMI) 19 or less, adult: Secondary | ICD-10-CM

## 2024-01-14 DIAGNOSIS — Z23 Encounter for immunization: Secondary | ICD-10-CM | POA: Diagnosis not present

## 2024-01-14 DIAGNOSIS — Z0001 Encounter for general adult medical examination with abnormal findings: Secondary | ICD-10-CM

## 2024-01-14 DIAGNOSIS — R42 Dizziness and giddiness: Secondary | ICD-10-CM | POA: Diagnosis not present

## 2024-01-14 NOTE — Progress Notes (Signed)
 Complete physical exam  Patient: Sheri Dickson   DOB: 2006-04-08   18 y.o. Female  MRN: [id number]  Subjective:    Chief Complaint  Patient presents with   Weakness    Sheri Dickson is a 18 y.o. female who presents today for a complete physical exam. She reports consuming a general diet. Stefani walks regularly. She generally feels well. She reports sleeping fairly well. She does have additional problems to discuss today.   Sheri Dickson needs a work note to return to work. She had an episode of dizziness with weakness and chills while working outside on 01/04/24. She had not been staying well hydrated. She went home and hydrated, cooled off and symptoms resolved. Denies LOC.  She would also like a referral to GYN. She has been having painful periods with heavier bleeding. Cycles are regular. Had normal pelvic US  and CT abdomen/pelvis in ER last month.   Most recent fall risk assessment:    01/14/2024   10:57 AM  Fall Risk   Falls in the past year? 0     Most recent depression screenings:    08/30/2023    1:05 PM 08/30/2023   12:59 PM  PHQ 2/9 Scores  PHQ - 2 Score 1 0  PHQ- 9 Score 8     Vision:Not within last year  and Dental: No current dental problems and No regular dental care   Past Medical History:  Diagnosis Date   Allergy    Asthma       Patient Care Team: Joesph Annabella HERO, FNP as PCP - General (Family Medicine)   Outpatient Medications Prior to Visit  Medication Sig   albuterol  (VENTOLIN  HFA) 108 (90 Base) MCG/ACT inhaler INHALE 2 PUFFS EVERY 4 HOURS AS NEEDED FOR WHEEZING OR SHORTNESS OF BREATH   buPROPion  (WELLBUTRIN  XL) 150 MG 24 hr tablet Take 1 tablet (150 mg total) by mouth daily.   cetirizine  (ZYRTEC ) 10 MG tablet Take 1 tablet (10 mg total) by mouth daily.   omeprazole  (PRILOSEC) 20 MG capsule Take 1 capsule (20 mg total) by mouth daily.   Vitamin D , Ergocalciferol , (DRISDOL ) 1.25 MG (50000 UNIT) CAPS capsule Take 1 capsule (50,000 Units total) by mouth every  7 (seven) days.   No facility-administered medications prior to visit.    ROS Negative unless specially indicated above in HPI.     Objective:     BP 96/66   Pulse 83   Temp 98 F (36.7 C) (Temporal)   Ht 5' 3 (1.6 m)   Wt 85 lb 9.6 oz (38.8 kg)   SpO2 98%   BMI 15.16 kg/m  Wt Readings from Last 3 Encounters:  01/14/24 85 lb 9.6 oz (38.8 kg)  12/14/23 88 lb (39.9 kg) (<1%, Z= -3.06)*  08/30/23 88 lb 6.4 oz (40.1 kg) (<1%, Z= -3.02)*   * Growth percentiles are based on CDC (Girls, 2-20 Years) data.      Physical Exam Vitals and nursing note reviewed.  Constitutional:      General: She is not in acute distress.    Appearance: She is underweight. She is not ill-appearing, toxic-appearing or diaphoretic.  HENT:     Head: Normocephalic.     Right Ear: Tympanic membrane, ear canal and external ear normal.     Left Ear: Tympanic membrane, ear canal and external ear normal.     Nose: Nose normal.     Mouth/Throat:     Mouth: Mucous membranes are moist.  Pharynx: Oropharynx is clear.   Eyes:     Extraocular Movements: Extraocular movements intact.     Conjunctiva/sclera: Conjunctivae normal.     Pupils: Pupils are equal, round, and reactive to light.    Cardiovascular:     Rate and Rhythm: Normal rate and regular rhythm.     Pulses: Normal pulses.     Heart sounds: Normal heart sounds. No murmur heard.    No friction rub. No gallop.  Pulmonary:     Effort: Pulmonary effort is normal.     Breath sounds: Normal breath sounds.  Abdominal:     General: Bowel sounds are normal. There is no distension.     Palpations: Abdomen is soft. There is no mass.     Tenderness: There is no abdominal tenderness. There is no guarding.   Musculoskeletal:     Cervical back: Normal range of motion and neck supple. No tenderness.     Right lower leg: No edema.     Left lower leg: No edema.   Skin:    General: Skin is warm and dry.     Capillary Refill: Capillary refill  takes less than 2 seconds.     Findings: No lesion or rash.   Neurological:     General: No focal deficit present.     Mental Status: She is alert and oriented to person, place, and time.     Cranial Nerves: No cranial nerve deficit.     Motor: No weakness.     Gait: Gait normal.   Psychiatric:        Mood and Affect: Mood normal.        Behavior: Behavior normal.        Thought Content: Thought content normal.        Judgment: Judgment normal.      No results found for any visits on 01/14/24.     Assessment & Plan:    Routine Health Maintenance and Physical Exam  Sheri Dickson was seen today for weakness and annual exam.  Diagnoses and all orders for this visit:  Routine general medical examination at a health care facility  Adult BMI <19 kg/sq m Well balanced diet.  Dizziness Now resolved. Return to work note provided.   Menorrhagia with regular cycle -     Ambulatory referral to Obstetrics / Gynecology  Need for vaccination -     Meningococcal B, OMV (Bexsero)  Immunization History  Administered Date(s) Administered   DTaP 03/14/2004, 05/14/2004, 07/18/2004, 01/30/2005, 01/25/2008   HIB (PRP-OMP) 03/14/2004, 05/14/2004, 07/18/2004, 01/30/2005   HPV 9-valent 05/03/2015, 08/08/2015, 11/15/2015   Hepatitis B December [birth year], 2003, 03/14/2004, 05/14/2004, 07/18/2004   IPV 03/14/2004, 05/14/2004, 07/18/2004, 02/12/2009   Influenza Nasal 05/14/2008, 05/22/2011, 06/24/2012   Influenza, Seasonal, Injecte, Preservative Fre 06/21/2023   Influenza,inj,Quad PF,6+ Mos 05/09/2013, 05/03/2015, 04/17/2016, 04/21/2017, 05/17/2018, 03/28/2019, 05/19/2021   Influenza-Unspecified 05/14/2008, 05/22/2011   MMR 01/30/2005, 01/25/2008   Meningococcal B, OMV 02/26/2020   Meningococcal Conjugate 02/26/2020   Meningococcal polysaccharide vaccine (MPSV4) 03/19/2015   PFIZER Comirnaty(Gray Top)Covid-19 Tri-Sucrose Vaccine 08/15/2020   Pneumococcal Conjugate-13 03/14/2004, 05/14/2004, 07/18/2004,  01/21/2006   Td 03/19/2015   Tdap 03/19/2015   Varicella 01/30/2005, 02/12/2009    Health Maintenance  Topic Date Due   Hepatitis C Screening  06/20/2024 (Originally 01/01/2022)   HIV Screening  06/20/2024 (Originally 01/02/2019)   COVID-19 Vaccine (2 - Pfizer risk series) 07/06/2024 (Originally 09/05/2020)   Pneumococcal Vaccine 80-28 Years old (1 of 1 - PPSV23, PCV20, or PCV21) 08/01/2024 (Originally  01/01/2010)   INFLUENZA VACCINE  02/18/2024   DTaP/Tdap/Td (8 - Td or Tdap) 03/18/2025   Hepatitis B Vaccines  Completed   HPV VACCINES  Completed   Meningococcal B Vaccine  Completed    Discussed health benefits of physical activity, and encouraged her to engage in regular exercise appropriate for her age and condition.  Problem List Items Addressed This Visit   None Visit Diagnoses       Routine general medical examination at a health care facility    -  Primary     Adult BMI <19 kg/sq m         Dizziness         Menorrhagia with regular cycle       Relevant Orders   Ambulatory referral to Obstetrics / Gynecology     Need for vaccination          Return in about 1 year (around 01/13/2025) for CPE.   The patient indicates understanding of these issues and agrees with the plan.  Annabella CHRISTELLA Search, FNP

## 2024-01-14 NOTE — Patient Instructions (Signed)

## 2024-07-06 ENCOUNTER — Ambulatory Visit (INDEPENDENT_AMBULATORY_CARE_PROVIDER_SITE_OTHER): Admitting: Family Medicine

## 2024-07-06 VITALS — BP 106/73 | HR 78 | Temp 98.5°F | Ht 63.0 in | Wt 89.2 lb

## 2024-07-06 DIAGNOSIS — Z114 Encounter for screening for human immunodeficiency virus [HIV]: Secondary | ICD-10-CM

## 2024-07-06 DIAGNOSIS — Z681 Body mass index (BMI) 19 or less, adult: Secondary | ICD-10-CM | POA: Insufficient documentation

## 2024-07-06 DIAGNOSIS — J309 Allergic rhinitis, unspecified: Secondary | ICD-10-CM | POA: Diagnosis not present

## 2024-07-06 DIAGNOSIS — E559 Vitamin D deficiency, unspecified: Secondary | ICD-10-CM

## 2024-07-06 DIAGNOSIS — Z1159 Encounter for screening for other viral diseases: Secondary | ICD-10-CM

## 2024-07-06 DIAGNOSIS — F419 Anxiety disorder, unspecified: Secondary | ICD-10-CM

## 2024-07-06 DIAGNOSIS — F331 Major depressive disorder, recurrent, moderate: Secondary | ICD-10-CM

## 2024-07-06 DIAGNOSIS — Z23 Encounter for immunization: Secondary | ICD-10-CM | POA: Diagnosis not present

## 2024-07-06 MED ORDER — CETIRIZINE HCL 10 MG PO TABS: 10.0000 mg | ORAL_TABLET | Freq: Every day | ORAL | 4 refills | Status: AC

## 2024-07-06 MED ORDER — BUPROPION HCL ER (XL) 150 MG PO TB24: 150.0000 mg | ORAL_TABLET | Freq: Every day | ORAL | 3 refills | Status: AC

## 2024-07-06 NOTE — Progress Notes (Signed)
 Established Patient Office Visit  Subjective   Patient ID: Sheri Dickson, female    DOB: 2006-06-06  Age: 18 y.o. MRN: [id number]  Chief Complaint  Patient presents with   Medical Management of Chronic Issues    HPI  History of Present Illness   Sheri Dickson is a 18 year old female who presents for medication follow-up and vitamin D  level recheck.  Depressive and anxiety symptoms - Increased symptoms of depression and anxiety attributed to end-of-semester stress - Symptoms are otherwise manageable  Vitamin d  deficiency - Vitamin D  levels were low almost a year ago - Completed a weekly high dose supplement course previously - Not currently taking a supplement  Gastroesophageal reflux symptoms - Discontinued omeprazole  due to worsening of symptoms - Symptoms improved after eliminating red meat from diet - Believes red meat was contributing to reflux symptoms  Allergic symptoms - Out of allergy medication - Increased symptoms since being out of medication  Nutritional concerns - Actively increasing calorie intake by eating more         01/14/2024   10:57 AM 08/30/2023    1:05 PM 08/30/2023   12:59 PM  Depression screen PHQ 2/9  Decreased Interest 1 1 0  Down, Depressed, Hopeless 1 0 0  PHQ - 2 Score 2 1 0  Altered sleeping 0 0   Tired, decreased energy 1 1   Change in appetite 2 1   Feeling bad or failure about yourself  0 0   Trouble concentrating 1 2   Moving slowly or fidgety/restless 1 3   Suicidal thoughts 0 0   PHQ-9 Score 7  8    Difficult doing work/chores Somewhat difficult Somewhat difficult      Data saved with a previous flowsheet row definition      01/14/2024   10:58 AM 08/30/2023    1:07 PM 08/02/2023    3:29 PM 06/21/2023    4:05 PM  GAD 7 : Generalized Anxiety Score  Nervous, Anxious, on Edge 1 1 1 1   Control/stop worrying 0 1 1 1   Worry too much - different things 1 0 1 1  Trouble relaxing 3 2 2 1   Restless 1 2 2 2   Easily annoyed or  irritable 2 1 1 1   Afraid - awful might happen 1 1 1 1   Total GAD 7 Score 9 8 9 8   Anxiety Difficulty Somewhat difficult Somewhat difficult Somewhat difficult Somewhat difficult       ROS As per HPI.    Objective:     BP 106/73   Pulse 78   Temp 98.5 F (36.9 C) (Temporal)   Ht 5' 3 (1.6 m)   Wt 89 lb 3.2 oz (40.5 kg)   SpO2 96%   BMI 15.80 kg/m  Wt Readings from Last 3 Encounters:  07/06/24 89 lb 3.2 oz (40.5 kg)  01/14/24 85 lb 9.6 oz (38.8 kg)  12/14/23 88 lb (39.9 kg) (<1%, Z= -3.06)*   * Growth percentiles are based on CDC (Girls, 2-20 Years) data.      Physical Exam Vitals and nursing note reviewed.  Constitutional:      General: She is not in acute distress.    Appearance: She is underweight. She is not ill-appearing, toxic-appearing or diaphoretic.  Cardiovascular:     Rate and Rhythm: Normal rate and regular rhythm.     Pulses: Normal pulses.     Heart sounds: Normal heart sounds. No murmur heard. Pulmonary:  Effort: Pulmonary effort is normal. No respiratory distress.     Breath sounds: Normal breath sounds.  Musculoskeletal:     Right lower leg: No edema.     Left lower leg: No edema.  Skin:    General: Skin is warm and dry.  Neurological:     General: No focal deficit present.     Mental Status: She is alert and oriented to person, place, and time.  Psychiatric:        Mood and Affect: Mood normal.        Behavior: Behavior normal.        Thought Content: Thought content normal.        Judgment: Judgment normal.      No results found for any visits on 07/06/24.    The ASCVD Risk score (Arnett DK, et al., 2019) failed to calculate for the following reasons:   The 2019 ASCVD risk score is only valid for ages 47 to 55   * - Cholesterol units were assumed    Assessment & Plan:   Sheri Dickson was seen today for medical management of chronic issues.  Diagnoses and all orders for this visit:  Depression, major, recurrent, moderate (HCC) -      buPROPion  (WELLBUTRIN  XL) 150 MG 24 hr tablet; Take 1 tablet (150 mg total) by mouth daily.  Anxiety -     buPROPion  (WELLBUTRIN  XL) 150 MG 24 hr tablet; Take 1 tablet (150 mg total) by mouth daily.  Vitamin D  deficiency -     Vitamin D , 25-hydroxy  Allergic rhinitis, unspecified seasonality, unspecified trigger -     cetirizine  (ZYRTEC ) 10 MG tablet; Take 1 tablet (10 mg total) by mouth daily.  Adult BMI <19 kg/sq m  Encounter for hepatitis C screening test for low risk patient -     HCV Ab w Reflex to Quant PCR  Encounter for screening for HIV -     HIV Antibody (routine testing w rflx)  Encounter for immunization -     Flu vaccine trivalent PF, 6mos and older(Flulaval,Afluria,Fluarix,Fluzone)   Assessment and Plan    Major depressive disorder, recurrent Stable. Denies SI. - Continue current regimen  Anxiety disorder Stable.  - Provided medication refills.  Vitamin D  deficiency Not currently on a supplement, previous low levels. - Ordered vitamin D  level test.  Allergic rhinitis Recent exacerbation of allergy symptoms since being out of medication - Provided allergy medication refill.  BMI 15 Discussed strategies to increase caloric intake.   General Health Maintenance Routine screenings and health maintenance discussed. - Ordered hepatitis C screening. - Ordered HIV screening.      Return in about 6 months (around 01/04/2025) for CPE.   The patient indicates understanding of these issues and agrees with the plan.  Sheri CHRISTELLA Search, FNP

## 2024-07-07 ENCOUNTER — Ambulatory Visit: Payer: Self-pay | Admitting: Family Medicine

## 2024-07-07 DIAGNOSIS — E559 Vitamin D deficiency, unspecified: Secondary | ICD-10-CM

## 2024-07-07 LAB — HIV ANTIBODY (ROUTINE TESTING W REFLEX): HIV Screen 4th Generation wRfx: NONREACTIVE

## 2024-07-07 LAB — VITAMIN D 25 HYDROXY (VIT D DEFICIENCY, FRACTURES): Vit D, 25-Hydroxy: 5.4 ng/mL — ABNORMAL LOW (ref 30.0–100.0)

## 2024-07-07 LAB — HCV INTERPRETATION

## 2024-07-07 LAB — HCV AB W REFLEX TO QUANT PCR: HCV Ab: NONREACTIVE

## 2024-07-07 MED ORDER — VITAMIN D (ERGOCALCIFEROL) 1.25 MG (50000 UNIT) PO CAPS: 50000.0000 [IU] | ORAL_CAPSULE | ORAL | 0 refills | Status: AC

## 2025-01-04 ENCOUNTER — Encounter: Admitting: Family Medicine

## 2025-01-15 ENCOUNTER — Encounter: Admitting: Family Medicine

## 2025-01-15 ENCOUNTER — Encounter: Payer: Self-pay | Admitting: Family Medicine
